# Patient Record
Sex: Female | Born: 1950
Health system: Southern US, Community
[De-identification: ages and names within clinical notes are randomized; demographics above are authoritative.]

## PROBLEM LIST (undated history)

## (undated) DIAGNOSIS — J189 Pneumonia, unspecified organism: Secondary | ICD-10-CM

## (undated) DIAGNOSIS — M1990 Unspecified osteoarthritis, unspecified site: Secondary | ICD-10-CM

## (undated) DIAGNOSIS — I10 Essential (primary) hypertension: Secondary | ICD-10-CM

## (undated) DIAGNOSIS — E7800 Pure hypercholesterolemia, unspecified: Secondary | ICD-10-CM

---

## 2001-09-28 ENCOUNTER — Encounter: Admission: RE | Admit: 2001-09-28 | Discharge: 2001-09-28 | Payer: Self-pay | Admitting: Emergency Medicine

## 2001-09-28 ENCOUNTER — Encounter: Payer: Self-pay | Admitting: Emergency Medicine

## 2003-05-14 ENCOUNTER — Encounter: Admission: RE | Admit: 2003-05-14 | Discharge: 2003-05-14 | Payer: Self-pay | Admitting: Emergency Medicine

## 2003-05-14 ENCOUNTER — Encounter: Payer: Self-pay | Admitting: Emergency Medicine

## 2003-09-19 ENCOUNTER — Encounter: Admission: RE | Admit: 2003-09-19 | Discharge: 2003-09-19 | Payer: Self-pay | Admitting: Emergency Medicine

## 2003-10-28 ENCOUNTER — Ambulatory Visit (HOSPITAL_COMMUNITY): Admission: RE | Admit: 2003-10-28 | Discharge: 2003-10-28 | Payer: Self-pay | Admitting: Emergency Medicine

## 2008-08-20 ENCOUNTER — Encounter: Admission: RE | Admit: 2008-08-20 | Discharge: 2008-08-20 | Payer: Self-pay | Admitting: Emergency Medicine

## 2009-03-08 ENCOUNTER — Emergency Department (HOSPITAL_COMMUNITY): Admission: EM | Admit: 2009-03-08 | Discharge: 2009-03-09 | Payer: Self-pay | Admitting: Emergency Medicine

## 2010-03-03 ENCOUNTER — Encounter: Admission: RE | Admit: 2010-03-03 | Discharge: 2010-03-03 | Payer: Self-pay | Admitting: Internal Medicine

## 2011-01-26 LAB — URINE MICROSCOPIC-ADD ON

## 2011-01-26 LAB — URINALYSIS, ROUTINE W REFLEX MICROSCOPIC
Bilirubin Urine: NEGATIVE
Glucose, UA: NEGATIVE mg/dL
Ketones, ur: NEGATIVE mg/dL
Nitrite: NEGATIVE
Urobilinogen, UA: 0.2 mg/dL (ref 0.0–1.0)

## 2011-01-26 LAB — URINE CULTURE

## 2011-05-19 ENCOUNTER — Other Ambulatory Visit: Payer: Self-pay | Admitting: Internal Medicine

## 2011-05-19 DIAGNOSIS — Z1231 Encounter for screening mammogram for malignant neoplasm of breast: Secondary | ICD-10-CM

## 2011-05-26 ENCOUNTER — Ambulatory Visit
Admission: RE | Admit: 2011-05-26 | Discharge: 2011-05-26 | Disposition: A | Discharge status: Home / Self Care | Payer: 59 | Source: Ambulatory Visit | Attending: Internal Medicine | Admitting: Internal Medicine

## 2011-05-26 DIAGNOSIS — Z1231 Encounter for screening mammogram for malignant neoplasm of breast: Secondary | ICD-10-CM

## 2011-12-21 ENCOUNTER — Emergency Department (HOSPITAL_COMMUNITY)
Admission: EM | Admit: 2011-12-21 | Discharge: 2011-12-21 | Disposition: A | Discharge status: Home / Self Care | Payer: 59 | Attending: Emergency Medicine | Admitting: Emergency Medicine

## 2011-12-21 ENCOUNTER — Emergency Department (HOSPITAL_COMMUNITY): Payer: 59

## 2011-12-21 ENCOUNTER — Other Ambulatory Visit: Payer: Self-pay

## 2011-12-21 ENCOUNTER — Encounter (HOSPITAL_COMMUNITY): Payer: Self-pay | Admitting: *Deleted

## 2011-12-21 DIAGNOSIS — R05 Cough: Secondary | ICD-10-CM | POA: Insufficient documentation

## 2011-12-21 DIAGNOSIS — R059 Cough, unspecified: Secondary | ICD-10-CM | POA: Insufficient documentation

## 2011-12-21 DIAGNOSIS — R110 Nausea: Secondary | ICD-10-CM | POA: Insufficient documentation

## 2011-12-21 DIAGNOSIS — E860 Dehydration: Secondary | ICD-10-CM

## 2011-12-21 DIAGNOSIS — R42 Dizziness and giddiness: Secondary | ICD-10-CM | POA: Insufficient documentation

## 2011-12-21 DIAGNOSIS — R0602 Shortness of breath: Secondary | ICD-10-CM | POA: Insufficient documentation

## 2011-12-21 DIAGNOSIS — Z8701 Personal history of pneumonia (recurrent): Secondary | ICD-10-CM | POA: Insufficient documentation

## 2011-12-21 HISTORY — DX: Unspecified osteoarthritis, unspecified site: M19.90

## 2011-12-21 HISTORY — DX: Pure hypercholesterolemia, unspecified: E78.00

## 2011-12-21 HISTORY — DX: Pneumonia, unspecified organism: J18.9

## 2011-12-21 HISTORY — DX: Essential (primary) hypertension: I10

## 2011-12-21 LAB — CBC
Hemoglobin: 12.9 g/dL (ref 12.0–15.0)
MCH: 30.6 pg (ref 26.0–34.0)
MCV: 91.7 fL (ref 78.0–100.0)
Platelets: 211 10*3/uL (ref 150–400)
WBC: 10.3 10*3/uL (ref 4.0–10.5)

## 2011-12-21 LAB — BASIC METABOLIC PANEL
BUN: 23 mg/dL (ref 6–23)
CO2: 28 mEq/L (ref 19–32)
Chloride: 102 mEq/L (ref 96–112)
GFR calc non Af Amer: 41 mL/min — ABNORMAL LOW (ref >90)
Glucose, Bld: 107 mg/dL — ABNORMAL HIGH (ref 70–99)

## 2011-12-21 LAB — DIFFERENTIAL
Basophils Absolute: 0.1 10*3/uL (ref 0.0–0.1)
Eosinophils Absolute: 0.4 10*3/uL (ref 0.0–0.7)
Eosinophils Relative: 4 % (ref 0–5)
Lymphocytes Relative: 28 % (ref 12–46)
Monocytes Absolute: 0.8 10*3/uL (ref 0.1–1.0)
Monocytes Relative: 8 % (ref 3–12)
Neutro Abs: 6.1 10*3/uL (ref 1.7–7.7)
Neutrophils Relative %: 60 % (ref 43–77)

## 2011-12-21 MED ORDER — ALBUTEROL SULFATE (5 MG/ML) 0.5% IN NEBU
5.0000 mg | INHALATION_SOLUTION | Freq: Once | RESPIRATORY_TRACT | Status: AC
  Administered 2011-12-21: 5 mg via RESPIRATORY_TRACT
  Filled 2011-12-21: qty 1

## 2011-12-21 MED ORDER — SODIUM CHLORIDE 0.9 % IV BOLUS (SEPSIS)
1000.0000 mL | Freq: Once | INTRAVENOUS | Status: AC
  Administered 2011-12-21: 1000 mL via INTRAVENOUS

## 2011-12-21 MED ORDER — ONDANSETRON HCL 4 MG/2ML IJ SOLN
4.0000 mg | Freq: Once | INTRAMUSCULAR | Status: AC
  Administered 2011-12-21: 4 mg via INTRAVENOUS
  Filled 2011-12-21: qty 2

## 2011-12-21 MED ORDER — IPRATROPIUM BROMIDE 0.02 % IN SOLN
0.5000 mg | Freq: Once | RESPIRATORY_TRACT | Status: AC
  Administered 2011-12-21: 0.5 mg via RESPIRATORY_TRACT
  Filled 2011-12-21: qty 2.5

## 2011-12-21 NOTE — ED Notes (Signed)
Patient returned from X-ray 

## 2011-12-21 NOTE — ED Provider Notes (Signed)
Medical screening examination/treatment/procedure(s) were conducted as a shared visit with non-physician practitioner(s) and myself.  I personally evaluated the patient during the encounter  Doug Sou, MD 12/21/11 1749

## 2011-12-21 NOTE — ED Notes (Signed)
Patient transported to X-ray 

## 2011-12-21 NOTE — ED Notes (Signed)
Pt from home with reports of dizziness and nausea off and on for 2 weeks but worsened this morning. Pt recently diagnosed with an URI and pneumonia treated with oral antibiotics.

## 2011-12-21 NOTE — ED Notes (Signed)
RT aware of breathing tx, will monitor. 

## 2011-12-21 NOTE — ED Provider Notes (Signed)
History     CSN: [id number]  Arrival date & time 12/21/11  1478   First MD Initiated Contact with Patient 12/21/11 [phone]      Chief Complaint  Patient presents with  . Dizziness  . Nausea    (Consider location/radiation/quality/duration/timing/severity/associated sxs/prior treatment) HPI History provided by pt.   Pt reports that she has had cough productive of clear sputum as well as SOB and dizziness x 1.5-2wks.  Recent CXR positive for pneumonia and pt treated w/ 5pak.  Cough has improved but SOB persistent and dizziness acutely worsened this morning.  Describes as lightheadedness that is aggravated by standing from seated position as well as movement and is usually associated w/ nausea.  No recent CP, palpitations, abd pain, vomiting or diarrhea.  No neurologic complaints.  Has not had a syncopal episode.  Has been drinking fluids.  No recent medication changes.     Past Medical History  Diagnosis Date  . Hypertension   . Osteoporosis   . Arthritis   . High cholesterol   . Pneumonia     History reviewed. No pertinent past surgical history.  History reviewed. No pertinent family history.  History  Substance Use Topics  . Smoking status: Never Smoker   . Smokeless tobacco: Never Used  . Alcohol Use: No    OB History    Grav Para Term Preterm Abortions TAB SAB Ect Mult Living                  Review of Systems  All other systems reviewed and are negative.    Allergies  Azithromycin  Home Medications  No current outpatient prescriptions on file.  BP 135/80  Pulse 72  Temp(Src) 98.3 F (36.8 C) (Oral)  Resp 18  Wt 170 lb (77.111 kg)  SpO2 100%  Physical Exam  Nursing note and vitals reviewed. Constitutional: She is oriented to person, place, and time. She appears well-developed and well-nourished. No distress.  HENT:  Head: Normocephalic and atraumatic.  Mouth/Throat: Oropharynx is clear and moist.  Eyes:       Normal appearance  Neck: Normal range  of motion.  Cardiovascular: Normal rate, regular rhythm and intact distal pulses.   Pulmonary/Chest: Effort normal and breath sounds normal.  Abdominal: Soft. Bowel sounds are normal. She exhibits no distension. There is no tenderness.       No CVA tenderness  Musculoskeletal: Normal range of motion. She exhibits no edema and no tenderness.  Neurological: She is alert and oriented to person, place, and time.  Skin: Skin is warm and dry. No rash noted.  Psychiatric: She has a normal mood and affect. Her behavior is normal.    ED Course  Procedures (including critical care time)   Date: 12/21/2011  Rate: 71  Rhythm: normal sinus rhythm  QRS Axis: normal  Intervals: normal  ST/T Wave abnormalities: normal  Conduction Disutrbances:none  Narrative Interpretation:   Old EKG Reviewed: none available   Labs Reviewed  BASIC METABOLIC PANEL - Abnormal; Notable for the following:    Glucose, Bld 107 (*)    Creatinine, Ser 1.36 (*)    Calcium 10.9 (*)    GFR calc non Af Amer 41 (*)    GFR calc Af Amer 47 (*)    All other components within normal limits  CBC  DIFFERENTIAL  POCT I-STAT TROPONIN I   Dg Chest 2 View  12/21/2011  *RADIOLOGY REPORT*  Clinical Data: 61 year old female with cough.  Pneumonia.  CHEST -  2 VIEW  Comparison: None.  Findings: Low lung volumes.  No pleural effusion or consolidation. No pneumothorax or pulmonary edema.  Cardiac size and mediastinal contours are within normal limits.  Visualized tracheal air column is within normal limits.  No acute osseous abnormality identified.  IMPRESSION: Low lung volumes, otherwise no acute cardiopulmonary abnormality.  Original Report Authenticated By: Harley Hallmark, M.D.     1. Dehydration       MDM  61yo F recently diagnosed with and treated for pneumonia presents w/ c/o lightheadedness.  Has had persistent cough and SOB as well.  On exam, afebrile and VS w/in nml range, NAD, well hydrated, heart w/ RRR, coughing, lungs  clear.  EKG, repeat CXR and labs pending.  Albuterol/atrovent neb ordered because gave pt relief at doctor's office last week.  She is receiving IV NS bolus as well.  Will reassess shortly.  9:30 AM   CXR neg for pneumonia.  Labs sig for neg troponin and elevated Cr, likely secondary to dehydration based on BUN/Cr ratio.  EKG non-ischemic. All results discussed w/ pt.  Sh reports that cough and SOB have improved w/ albuterol.  Lightheadedness likely secondary to URI as well as dehydration.  Will reassess when bolus completed and likely d/c home at that time.  10:42 AM   Pt is smiling and reports feeling better.  D/c'd home.  Recommended oral hydration and f/u with Dr. Felipa Eth this week.  11:26 AM        Otilio Miu, PA 12/21/11 1126

## 2011-12-21 NOTE — ED Provider Notes (Signed)
Complains of lightheadedness continued cough for 2 weeks accompanied by nausea. Treated with azithromycin with some improvement of cough denies fever. Seen by[facility] diagnosed with pneumonia. Cough is improved nausea and lightheadedness continues. Reports had single episode of right upper quadrant pain 5 days ago which has resolved none since On exam alert nontoxic heart regular rate and rhythm no murmurs lungs clear to auscultation with coughing abdomen obese nontender extremities without edema   Doug Sou, MD 12/21/11 [phone]

## 2011-12-21 NOTE — ED Notes (Signed)
PA at bedside.

## 2012-11-28 ENCOUNTER — Other Ambulatory Visit: Payer: Self-pay | Admitting: Internal Medicine

## 2012-11-28 DIAGNOSIS — Z1231 Encounter for screening mammogram for malignant neoplasm of breast: Secondary | ICD-10-CM

## 2012-12-26 ENCOUNTER — Ambulatory Visit: Payer: 59

## 2013-01-29 ENCOUNTER — Encounter (HOSPITAL_COMMUNITY): Payer: Self-pay | Admitting: Emergency Medicine

## 2013-01-29 ENCOUNTER — Emergency Department (HOSPITAL_COMMUNITY)
Admission: EM | Admit: 2013-01-29 | Discharge: 2013-01-29 | Disposition: A | Discharge status: Home / Self Care | Payer: No Typology Code available for payment source | Source: Home / Self Care | Attending: Emergency Medicine | Admitting: Emergency Medicine

## 2013-01-29 ENCOUNTER — Emergency Department (INDEPENDENT_AMBULATORY_CARE_PROVIDER_SITE_OTHER): Payer: No Typology Code available for payment source

## 2013-01-29 DIAGNOSIS — S7000XA Contusion of unspecified hip, initial encounter: Secondary | ICD-10-CM

## 2013-01-29 DIAGNOSIS — S7002XA Contusion of left hip, initial encounter: Secondary | ICD-10-CM

## 2013-01-29 MED ORDER — IBUPROFEN 600 MG PO TABS: 600.0000 mg | ORAL_TABLET | Freq: Three times a day (TID) | ORAL | Status: DC | PRN

## 2013-01-29 MED ORDER — HYDROCODONE-ACETAMINOPHEN 5-325 MG PO TABS: 2.0000 | ORAL_TABLET | ORAL | Status: DC | PRN

## 2013-01-29 NOTE — ED Provider Notes (Addendum)
History     CSN: [id number]  Arrival date & time 01/29/13  1409   First MD Initiated Contact with Patient 01/29/13 1416      Chief Complaint  Patient presents with  . Back Pain    (Consider location/radiation/quality/duration/timing/severity/associated sxs/prior treatment) HPI Comments: Reports falling of porch onto a plastic table, presents to urgent care complaining of left-sided lower back pain exacerbated with walking and movement. ( Patient points to posterior aspect of left hip), urged more when I lay down"... patient denies any abdominal pain changes in urination patterns, denies any weakness or paresthesias to her lower extremities. Denies any nausea or vomiting did take a muscle relaxer yesterday but did not help much. "It's sore at touch and with movement the left lower part of my back'....  Patient is a 62 y.o. female presenting with back pain. The history is provided by the patient.  Back Pain Location:  Sacro-iliac joint Quality:  Aching and stiffness Stiffness is present:  All day Radiates to:  Does not radiate Pain severity:  Moderate Onset quality:  Sudden Duration:  1 day Timing:  Constant Progression:  Partially resolved Chronicity:  New Context: not falling   Worsened by:  Nothing tried Ineffective treatments:  None tried Associated symptoms: no abdominal pain, no bowel incontinence, no chest pain, no dysuria, no fever, no leg pain, no numbness, no perianal numbness, no tingling and no weakness   Risk factors: no hx of cancer     Past Medical History  Diagnosis Date  . Hypertension   . Osteoporosis   . Arthritis   . High cholesterol   . Pneumonia     History reviewed. No pertinent past surgical history.  History reviewed. No pertinent family history.  History  Substance Use Topics  . Smoking status: Never Smoker   . Smokeless tobacco: Never Used  . Alcohol Use: No    OB History   Grav Para Term Preterm Abortions TAB SAB Ect Mult Living                Review of Systems  Constitutional: Positive for activity change. Negative for fever, chills, diaphoresis, appetite change and fatigue.  Respiratory: Negative for shortness of breath.   Cardiovascular: Negative for chest pain and leg swelling.  Gastrointestinal: Negative for abdominal pain and bowel incontinence.  Genitourinary: Negative for dysuria.  Musculoskeletal: Positive for back pain. Negative for myalgias, joint swelling and arthralgias.  Skin: Negative for pallor, rash and wound.  Neurological: Negative for tingling, weakness and numbness.    Allergies  Azithromycin  Home Medications   Current Outpatient Rx  Name  Route  Sig  Dispense  Refill  . albuterol (PROAIR HFA) 108 (90 BASE) MCG/ACT inhaler   Inhalation   Inhale 2 puffs into the lungs 2 (two) times daily. Wheezing         . benazepril-hydrochlorthiazide (LOTENSIN HCT) 20-12.5 MG per tablet   Oral   Take 1 tablet by mouth daily.         . fluticasone-salmeterol (ADVAIR HFA) 45-21 MCG/ACT inhaler   Inhalation   Inhale 2 puffs into the lungs 2 (two) times daily.         Marland Kitchen HYDROcodone-acetaminophen (NORCO/VICODIN) 5-325 MG per tablet   Oral   Take 2 tablets by mouth every 4 (four) hours as needed for pain.   10 tablet   0   . Hydrocodone-Homatropine (HYDROMET PO)   Oral   Take 5 mLs by mouth every 6 (six) hours. Cough         .  ibuprofen (ADVIL,MOTRIN) 600 MG tablet   Oral   Take 1 tablet (600 mg total) by mouth every 8 (eight) hours as needed for pain.   15 tablet   0   . levofloxacin (LEVAQUIN) 500 MG tablet   Oral   Take 500 mg by mouth daily. Pt finished on Friday 12-17-11 for 7 day therapy         . Multiple Vitamins-Minerals (MULTIVITAMIN WITH MINERALS) tablet   Oral   Take 1 tablet by mouth daily.         . predniSONE (DELTASONE) 20 MG tablet   Oral   Take 20 mg by mouth daily. Finished therapy on Friday ( 12-17-11 ) for 7 day therapy         . simvastatin (ZOCOR)  20 MG tablet   Oral   Take 20 mg by mouth every evening.           BP 150/90  Pulse 81  Temp(Src) 98.4 F (36.9 C) (Oral)  Resp 18  SpO2 99%  Physical Exam  Nursing note and vitals reviewed. Constitutional: Vital signs are normal.  Non-toxic appearance. She does not have a sickly appearance. She does not appear ill. No distress.  Eyes: Conjunctivae are normal.  Abdominal: Soft. There is no tenderness.  Musculoskeletal: She exhibits tenderness.       Back:  Neurological: She is alert.  Skin: No rash noted. No erythema.    ED Course  Procedures (including critical care time)  Labs Reviewed - No data to display Dg Hip Complete Left  01/29/2013  *RADIOLOGY REPORT*  Clinical Data: Pain  LEFT HIP - COMPLETE 2+ VIEW  Comparison: None.  Findings: There is mild joint space narrowing bilaterally.  No osteophyte formation.  No evidence of avascular necrosis.  No evidence of pelvic or hip fracture.  Sacroiliac osteoarthritis is present bilaterally.  IMPRESSION: Mild joint space narrowing bilaterally.  The no acute or traumatic finding.  Bilateral sacroiliac osteoarthritis.   Original Report Authenticated By: Paulina Fusi, M.D.      1. Contusion, hip, left, initial encounter       MDM  Status post fall with left hip and lower back contusion/ patient was prescribed ibuprofen -stripped her to followup with primary care doctor pain was to persist beyond 3-5 days.  Jimmie Molly, MD 01/29/13 1554  Jimmie Molly, MD 01/29/13 1554  Jimmie Molly, MD 01/29/13 [phone]

## 2013-01-29 NOTE — ED Notes (Signed)
Pt c/o lower back pain on left side onset yest Reports falling of porch onto a plastic table Pain increases w/activity and when she coughs Denies: f/v/n/d Took a muscle relaxer thinking it was muscle spasms   She is alert and oriented w/no signs of acute distress.

## 2013-12-11 ENCOUNTER — Ambulatory Visit
Admission: RE | Admit: 2013-12-11 | Discharge: 2013-12-11 | Disposition: A | Discharge status: Home / Self Care | Payer: No Typology Code available for payment source | Source: Ambulatory Visit | Attending: Internal Medicine | Admitting: Internal Medicine

## 2013-12-11 DIAGNOSIS — Z1231 Encounter for screening mammogram for malignant neoplasm of breast: Secondary | ICD-10-CM

## 2014-03-30 ENCOUNTER — Encounter (HOSPITAL_COMMUNITY): Payer: Self-pay | Admitting: Emergency Medicine

## 2014-03-30 ENCOUNTER — Emergency Department (HOSPITAL_COMMUNITY)
Admission: EM | Admit: 2014-03-30 | Discharge: 2014-03-30 | Disposition: A | Discharge status: Home / Self Care | Payer: Medicare HMO | Source: Home / Self Care | Attending: Emergency Medicine | Admitting: Emergency Medicine

## 2014-03-30 DIAGNOSIS — S161XXA Strain of muscle, fascia and tendon at neck level, initial encounter: Secondary | ICD-10-CM

## 2014-03-30 DIAGNOSIS — S139XXA Sprain of joints and ligaments of unspecified parts of neck, initial encounter: Secondary | ICD-10-CM

## 2014-03-30 DIAGNOSIS — X58XXXA Exposure to other specified factors, initial encounter: Secondary | ICD-10-CM

## 2014-03-30 MED ORDER — IBUPROFEN 600 MG PO TABS: 600.0000 mg | ORAL_TABLET | Freq: Three times a day (TID) | ORAL | Status: DC | PRN

## 2014-03-30 MED ORDER — IBUPROFEN 800 MG PO TABS
ORAL_TABLET | ORAL | Status: AC
  Filled 2014-03-30: qty 1

## 2014-03-30 MED ORDER — METAXALONE 400 MG PO TABS: 400.0000 mg | ORAL_TABLET | Freq: Two times a day (BID) | ORAL | Status: DC | PRN

## 2014-03-30 MED ORDER — IBUPROFEN 800 MG PO TABS
800.0000 mg | ORAL_TABLET | Freq: Once | ORAL | Status: AC
  Administered 2014-03-30: 800 mg via ORAL

## 2014-03-30 NOTE — ED Notes (Signed)
Pt  Reports  Neck  Pain  And  Upper  Back pain  As  Well  X  5  Days  denys  Any  Injury  Reports  Her  Neck  Feels  stiff

## 2014-03-30 NOTE — Discharge Instructions (Signed)
Muscle Strain A muscle strain is an injury that occurs when a muscle is stretched beyond its normal length. Usually a small number of muscle fibers are torn when this happens. Muscle strain is rated in degrees. First-degree strains have the least amount of muscle fiber tearing and pain. Second-degree and third-degree strains have increasingly more tearing and pain.  Usually, recovery from muscle strain takes 1 2 weeks. Complete healing takes 5 6 weeks.  CAUSES  Muscle strain happens when a sudden, violent force placed on a muscle stretches it too far. This may occur with lifting, sports, or a fall.  RISK FACTORS Muscle strain is especially common in athletes.  SIGNS AND SYMPTOMS At the site of the muscle strain, there may be:  Pain.  Bruising.  Swelling.  Difficulty using the muscle due to pain or lack of normal function. DIAGNOSIS  Your health care provider will perform a physical exam and ask about your medical history. TREATMENT  Often, the best treatment for a muscle strain is resting, icing, and applying cold compresses to the injured area.  HOME CARE INSTRUCTIONS   Use the PRICE method of treatment to promote muscle healing during the first 2 3 days after your injury. The PRICE method involves:  Protecting the muscle from being injured again.  Restricting your activity and resting the injured body part.  Icing your injury. To do this, put ice in a plastic bag. Place a towel between your skin and the bag. Then, apply the ice and leave it on from 15 20 minutes each hour. After the third day, switch to moist heat packs.  Apply compression to the injured area with a splint or elastic bandage. Be careful not to wrap it too tightly. This may interfere with blood circulation or increase swelling.  Elevate the injured body part above the level of your heart as often as you can.  Only take over-the-counter or prescription medicines for pain, discomfort, or fever as directed by your  health care provider.  Warming up prior to exercise helps to prevent future muscle strains. SEEK MEDICAL CARE IF:   You have increasing pain or swelling in the injured area.  You have numbness, tingling, or a significant loss of strength in the injured area. MAKE SURE YOU:   Understand these instructions.  Will watch your condition.  Will get help right away if you are not doing well or get worse. Document Released: 10/04/2005 Document Revised: 07/25/2013 Document Reviewed: 05/03/2013 [facility] Patient Information 2014 [place], [place].  Cervical Sprain A cervical sprain is an injury in the neck in which the strong, fibrous tissues (ligaments) that connect your neck bones stretch or tear. Cervical sprains can range from mild to severe. Severe cervical sprains can cause the neck vertebrae to be unstable. This can lead to damage of the spinal cord and can result in serious nervous system problems. The amount of time it takes for a cervical sprain to get better depends on the cause and extent of the injury. Most cervical sprains heal in 1 to 3 weeks. CAUSES  Severe cervical sprains may be caused by:   Contact sport injuries (such as from football, rugby, wrestling, hockey, auto racing, gymnastics, diving, martial arts, or boxing).   Motor vehicle collisions.   Whiplash injuries. This is an injury from a sudden forward-and backward whipping movement of the head and neck.  Falls.  Mild cervical sprains may be caused by:   Being in an awkward position, such as while cradling a telephone  between your ear and shoulder.   Sitting in a chair that does not offer proper support.   Working at a poorly Landscape [employer] station.   Looking up or down for long periods of time.  SYMPTOMS   Pain, soreness, stiffness, or a burning sensation in the front, back, or sides of the neck. This discomfort may develop immediately after the injury or slowly, 24 hours or more after the injury.    Pain or tenderness directly in the middle of the back of the neck.   Shoulder or upper back pain.   Limited ability to move the neck.   Headache.   Dizziness.   Weakness, numbness, or tingling in the hands or arms.   Muscle spasms.   Difficulty swallowing or chewing.   Tenderness and swelling of the neck.  DIAGNOSIS  Most of the time your health care provider can diagnose a cervical sprain by taking your history and doing a physical exam. Your health care provider will ask about previous neck injuries and any known neck problems, such as arthritis in the neck. X-rays may be taken to find out if there are any other problems, such as with the bones of the neck. Other tests, such as a CT scan or MRI, may also be needed.  TREATMENT  Treatment depends on the severity of the cervical sprain. Mild sprains can be treated with rest, keeping the neck in place (immobilization), and pain medicines. Severe cervical sprains are immediately immobilized. Further treatment is done to help with pain, muscle spasms, and other symptoms and may include:  Medicines, such as pain relievers, numbing medicines, or muscle relaxants.   Physical therapy. This may involve stretching exercises, strengthening exercises, and posture training. Exercises and improved posture can help stabilize the neck, strengthen muscles, and help stop symptoms from returning.  HOME CARE INSTRUCTIONS   Put ice on the injured area.   Put ice in a plastic bag.   Place a towel between your skin and the bag.   Leave the ice on for 15 20 minutes, 3 4 times a day.   If your injury was severe, you may have been given a cervical collar to wear. A cervical collar is a two-piece collar designed to keep your neck from moving while it heals.  Do not remove the collar unless instructed by your health care provider.  If you have long hair, keep it outside of the collar.  Ask your health care provider before making any  adjustments to your collar. Minor adjustments may be required over time to improve comfort and reduce pressure on your chin or on the back of your head.  Ifyou are allowed to remove the collar for cleaning or bathing, follow your health care provider's instructions on how to do so safely.  Keep your collar clean by wiping it with mild soap and water and drying it completely. If the collar you have been given includes removable pads, remove them every 1 2 days and hand wash them with soap and water. Allow them to air dry. They should be completely dry before you wear them in the collar.  If you are allowed to remove the collar for cleaning and bathing, wash and dry the skin of your neck. Check your skin for irritation or sores. If you see any, tell your health care provider.  Do not drive while wearing the collar.   Only take over-the-counter or prescription medicines for pain, discomfort, or fever as directed by your health care  provider.   Keep all follow-up appointments as directed by your health care provider.   Keep all physical therapy appointments as directed by your health care provider.   Make any needed adjustments to your workstation to promote good posture.   Avoid positions and activities that make your symptoms worse.   Warm up and stretch before being active to help prevent problems.  SEEK MEDICAL CARE IF:   Your pain is not controlled with medicine.   You are unable to decrease your pain medicine over time as planned.   Your activity level is not improving as expected.  SEEK IMMEDIATE MEDICAL CARE IF:   You develop any bleeding.  You develop stomach upset.  You have signs of an allergic reaction to your medicine.   Your symptoms get worse.   You develop new, unexplained symptoms.   You have numbness, tingling, weakness, or paralysis in any part of your body.  MAKE SURE YOU:   Understand these instructions.  Will watch your condition.  Will  get help right away if you are not doing well or get worse. Document Released: 08/01/2007 Document Revised: 07/25/2013 Document Reviewed: 04/11/2013 [facility] Patient Information 2014 [place].

## 2014-03-30 NOTE — ED Provider Notes (Signed)
Medical screening examination/treatment/procedure(s) were performed by non-physician practitioner and as supervising physician I was immediately available for consultation/collaboration.  Philipp Deputy, M.D.  Harden Mo, MD 03/30/14 2213

## 2014-03-30 NOTE — ED Provider Notes (Signed)
CSN: [id number]     Arrival date & time 03/30/14  4627 History   First MD Initiated Contact with Patient 03/30/14 1023     Chief Complaint  Patient presents with  . Torticollis   (Consider location/radiation/quality/duration/timing/severity/associated sxs/prior Treatment) HPI Comments: Patient developed "crick" in right lateral neck and along top of right shoulder on 03/25/2014. 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 Has not tried any sort of medications as home for pain. Denies specific injury. No changes in strength or sensation of either upper extremity. No chest pain or dyspnea. No fever or headache.  PCP: Dr. Dagmar Hait  The history is provided by the patient.    Past Medical History  Diagnosis Date  . Hypertension   . Osteoporosis   . Arthritis   . High cholesterol   . Pneumonia    History reviewed. No pertinent past surgical history. History reviewed. No pertinent family history. History  Substance Use Topics  . Smoking status: Never Smoker   . Smokeless tobacco: Never Used  . Alcohol Use: No   OB History   Grav Para Term Preterm Abortions TAB SAB Ect Mult Living                 Review of Systems  All other systems reviewed and are negative.   Allergies  Azithromycin  Home Medications   Prior to Admission medications   Medication Sig Start Date End Date Taking? Authorizing Provider  albuterol (PROAIR HFA) 108 (90 BASE) MCG/ACT inhaler Inhale 2 puffs into the lungs 2 (two) times daily. Wheezing    Historical Provider, MD  benazepril-hydrochlorthiazide (LOTENSIN HCT) 20-12.5 MG per tablet Take 1 tablet by mouth daily.    Historical Provider, MD  fluticasone-salmeterol (ADVAIR HFA) [phone] MCG/ACT inhaler Inhale 2 puffs into the lungs 2 (two) times daily.    Historical Provider, MD  HYDROcodone-acetaminophen (NORCO/VICODIN) 5-325 MG per tablet Take 2 tablets by mouth every 4 (four) hours as needed for  pain. 01/29/13   Rosana Hoes, MD  Hydrocodone-Homatropine (HYDROMET PO) Take 5 mLs by mouth every 6 (six) hours. Cough    Historical Provider, MD  ibuprofen (ADVIL,MOTRIN) 600 MG tablet Take 1 tablet (600 mg total) by mouth every 8 (eight) hours as needed for pain. 01/29/13   Rosana Hoes, MD  ibuprofen (ADVIL,MOTRIN) 600 MG tablet Take 1 tablet (600 mg total) by mouth every 8 (eight) hours as needed for mild pain or moderate pain. 03/30/14   Lahoma Rocker, PA  levofloxacin (LEVAQUIN) 500 MG tablet Take 500 mg by mouth daily. Pt finished on Friday 12-17-11 for 7 day therapy    Historical Provider, MD  metaxalone (SKELAXIN) 400 MG tablet Take 1 tablet (400 mg total) by mouth 2 (two) times daily as needed for muscle spasms. 03/30/14   Lahoma Rocker, PA  Multiple Vitamins-Minerals (MULTIVITAMIN WITH MINERALS) tablet Take 1 tablet by mouth daily.    Historical Provider, MD  predniSONE (DELTASONE) 20 MG tablet Take 20 mg by mouth daily. Finished therapy on Friday ( 12-17-11 ) for 7 day therapy    Historical Provider, MD  simvastatin (ZOCOR) 20 MG tablet Take 20 mg by mouth every evening.    Historical Provider, MD   BP 150/91  Pulse 80  Temp(Src) 97.7 F (36.5 C) (Oral)  Resp 18  SpO2 94% Physical Exam  Nursing note and vitals reviewed. Constitutional: She is oriented to person, place, and time.  She appears well-developed and well-nourished. No distress.  HENT:  Head: Normocephalic and atraumatic.  Right Ear: Hearing and external ear normal.  Left Ear: Hearing and external ear normal.  Eyes: Conjunctivae are normal. No scleral icterus.  Neck: Trachea normal and phonation normal. Neck supple. Muscular tenderness present. No tracheal tenderness present. No mass and no thyromegaly present.    Outlined area is with tenderness with ROM and palpation  Cardiovascular: Normal rate, regular rhythm and normal heart sounds.   Pulmonary/Chest: Effort normal and breath sounds normal. No stridor.    Musculoskeletal: She exhibits no edema.  Neurological: She is alert and oriented to person, place, and time.  Skin: Skin is warm and dry.  Psychiatric: She has a normal mood and affect. Her behavior is normal.    ED Course  Procedures (including critical care time) Labs Review Labs Reviewed - No data to display  Imaging Review No results found.   MDM   1. Cervical myofascial strain    Mild cervical strain with associated muscle spasm and without rash or focal neurological deficit. Will treat with ibuprofen and skelaxin and advise limits to heavy lifting over next 7 days and follow up with PCP if no improvement.   [place], [place] 03/30/14 1133

## 2015-06-05 ENCOUNTER — Other Ambulatory Visit: Payer: Self-pay | Admitting: Internal Medicine

## 2015-06-05 DIAGNOSIS — Z1231 Encounter for screening mammogram for malignant neoplasm of breast: Secondary | ICD-10-CM

## 2015-06-18 ENCOUNTER — Ambulatory Visit
Admission: RE | Admit: 2015-06-18 | Discharge: 2015-06-18 | Disposition: A | Discharge status: Home / Self Care | Payer: Medicare HMO | Source: Ambulatory Visit | Attending: Internal Medicine | Admitting: Internal Medicine

## 2015-06-18 DIAGNOSIS — Z1231 Encounter for screening mammogram for malignant neoplasm of breast: Secondary | ICD-10-CM

## 2015-06-20 ENCOUNTER — Other Ambulatory Visit: Payer: Self-pay | Admitting: Internal Medicine

## 2015-06-20 DIAGNOSIS — R928 Other abnormal and inconclusive findings on diagnostic imaging of breast: Secondary | ICD-10-CM

## 2015-06-30 ENCOUNTER — Ambulatory Visit
Admission: RE | Admit: 2015-06-30 | Discharge: 2015-06-30 | Disposition: A | Discharge status: Home / Self Care | Payer: Medicare HMO | Source: Ambulatory Visit | Attending: Internal Medicine | Admitting: Internal Medicine

## 2015-06-30 DIAGNOSIS — R928 Other abnormal and inconclusive findings on diagnostic imaging of breast: Secondary | ICD-10-CM

## 2015-11-27 DIAGNOSIS — H25041 Posterior subcapsular polar age-related cataract, right eye: Secondary | ICD-10-CM | POA: Diagnosis not present

## 2015-12-02 DIAGNOSIS — E784 Other hyperlipidemia: Secondary | ICD-10-CM | POA: Diagnosis not present

## 2015-12-02 DIAGNOSIS — I10 Essential (primary) hypertension: Secondary | ICD-10-CM | POA: Diagnosis not present

## 2015-12-02 DIAGNOSIS — M859 Disorder of bone density and structure, unspecified: Secondary | ICD-10-CM | POA: Diagnosis not present

## 2015-12-09 DIAGNOSIS — E784 Other hyperlipidemia: Secondary | ICD-10-CM | POA: Diagnosis not present

## 2015-12-09 DIAGNOSIS — M859 Disorder of bone density and structure, unspecified: Secondary | ICD-10-CM | POA: Diagnosis not present

## 2015-12-09 DIAGNOSIS — E668 Other obesity: Secondary | ICD-10-CM | POA: Diagnosis not present

## 2015-12-09 DIAGNOSIS — Z1389 Encounter for screening for other disorder: Secondary | ICD-10-CM | POA: Diagnosis not present

## 2015-12-09 DIAGNOSIS — J45909 Unspecified asthma, uncomplicated: Secondary | ICD-10-CM | POA: Diagnosis not present

## 2015-12-09 DIAGNOSIS — Z0000 Encounter for general adult medical examination without abnormal findings: Secondary | ICD-10-CM | POA: Diagnosis not present

## 2015-12-09 DIAGNOSIS — Z6831 Body mass index (BMI) 31.0-31.9, adult: Secondary | ICD-10-CM | POA: Diagnosis not present

## 2015-12-09 DIAGNOSIS — I10 Essential (primary) hypertension: Secondary | ICD-10-CM | POA: Diagnosis not present

## 2015-12-09 DIAGNOSIS — J302 Other seasonal allergic rhinitis: Secondary | ICD-10-CM | POA: Diagnosis not present

## 2015-12-09 DIAGNOSIS — K219 Gastro-esophageal reflux disease without esophagitis: Secondary | ICD-10-CM | POA: Diagnosis not present

## 2016-12-08 DIAGNOSIS — M859 Disorder of bone density and structure, unspecified: Secondary | ICD-10-CM | POA: Diagnosis not present

## 2016-12-08 DIAGNOSIS — E784 Other hyperlipidemia: Secondary | ICD-10-CM | POA: Diagnosis not present

## 2016-12-08 DIAGNOSIS — Z0000 Encounter for general adult medical examination without abnormal findings: Secondary | ICD-10-CM | POA: Diagnosis not present

## 2016-12-08 DIAGNOSIS — I10 Essential (primary) hypertension: Secondary | ICD-10-CM | POA: Diagnosis not present

## 2016-12-15 DIAGNOSIS — M1990 Unspecified osteoarthritis, unspecified site: Secondary | ICD-10-CM | POA: Diagnosis not present

## 2016-12-15 DIAGNOSIS — M8580 Other specified disorders of bone density and structure, unspecified site: Secondary | ICD-10-CM | POA: Diagnosis not present

## 2016-12-15 DIAGNOSIS — J302 Other seasonal allergic rhinitis: Secondary | ICD-10-CM | POA: Diagnosis not present

## 2016-12-15 DIAGNOSIS — E669 Obesity, unspecified: Secondary | ICD-10-CM | POA: Diagnosis not present

## 2016-12-15 DIAGNOSIS — E784 Other hyperlipidemia: Secondary | ICD-10-CM | POA: Diagnosis not present

## 2016-12-15 DIAGNOSIS — I10 Essential (primary) hypertension: Secondary | ICD-10-CM | POA: Diagnosis not present

## 2016-12-15 DIAGNOSIS — Z1389 Encounter for screening for other disorder: Secondary | ICD-10-CM | POA: Diagnosis not present

## 2016-12-15 DIAGNOSIS — Z0000 Encounter for general adult medical examination without abnormal findings: Secondary | ICD-10-CM | POA: Diagnosis not present

## 2016-12-15 DIAGNOSIS — J45909 Unspecified asthma, uncomplicated: Secondary | ICD-10-CM | POA: Diagnosis not present

## 2016-12-15 DIAGNOSIS — K219 Gastro-esophageal reflux disease without esophagitis: Secondary | ICD-10-CM | POA: Diagnosis not present

## 2016-12-24 DIAGNOSIS — Z1211 Encounter for screening for malignant neoplasm of colon: Secondary | ICD-10-CM | POA: Diagnosis not present

## 2016-12-24 DIAGNOSIS — Z1212 Encounter for screening for malignant neoplasm of rectum: Secondary | ICD-10-CM | POA: Diagnosis not present

## 2016-12-30 DIAGNOSIS — Z1212 Encounter for screening for malignant neoplasm of rectum: Secondary | ICD-10-CM | POA: Diagnosis not present

## 2017-03-16 DIAGNOSIS — H2513 Age-related nuclear cataract, bilateral: Secondary | ICD-10-CM | POA: Diagnosis not present

## 2017-03-16 DIAGNOSIS — H25041 Posterior subcapsular polar age-related cataract, right eye: Secondary | ICD-10-CM | POA: Diagnosis not present

## 2017-06-11 DIAGNOSIS — R69 Illness, unspecified: Secondary | ICD-10-CM | POA: Diagnosis not present

## 2017-06-24 ENCOUNTER — Other Ambulatory Visit: Payer: Self-pay | Admitting: Internal Medicine

## 2017-06-24 DIAGNOSIS — Z124 Encounter for screening for malignant neoplasm of cervix: Secondary | ICD-10-CM | POA: Diagnosis not present

## 2017-06-24 DIAGNOSIS — D259 Leiomyoma of uterus, unspecified: Secondary | ICD-10-CM | POA: Diagnosis not present

## 2017-06-24 DIAGNOSIS — Z01419 Encounter for gynecological examination (general) (routine) without abnormal findings: Secondary | ICD-10-CM | POA: Diagnosis not present

## 2017-06-24 DIAGNOSIS — R61 Generalized hyperhidrosis: Secondary | ICD-10-CM | POA: Diagnosis not present

## 2017-06-24 DIAGNOSIS — Z6830 Body mass index (BMI) 30.0-30.9, adult: Secondary | ICD-10-CM | POA: Diagnosis not present

## 2017-06-24 DIAGNOSIS — Z1231 Encounter for screening mammogram for malignant neoplasm of breast: Secondary | ICD-10-CM

## 2017-06-24 DIAGNOSIS — M859 Disorder of bone density and structure, unspecified: Secondary | ICD-10-CM | POA: Diagnosis not present

## 2017-07-01 ENCOUNTER — Ambulatory Visit: Payer: Medicare HMO

## 2017-07-29 ENCOUNTER — Ambulatory Visit
Admission: RE | Admit: 2017-07-29 | Discharge: 2017-07-29 | Disposition: A | Discharge status: Home / Self Care | Payer: Medicare HMO | Source: Ambulatory Visit | Attending: Internal Medicine | Admitting: Internal Medicine

## 2017-07-29 DIAGNOSIS — Z1231 Encounter for screening mammogram for malignant neoplasm of breast: Secondary | ICD-10-CM

## 2017-11-08 DIAGNOSIS — Z6830 Body mass index (BMI) 30.0-30.9, adult: Secondary | ICD-10-CM | POA: Diagnosis not present

## 2017-11-08 DIAGNOSIS — M1990 Unspecified osteoarthritis, unspecified site: Secondary | ICD-10-CM | POA: Diagnosis not present

## 2017-11-08 DIAGNOSIS — M5416 Radiculopathy, lumbar region: Secondary | ICD-10-CM | POA: Diagnosis not present

## 2017-11-08 DIAGNOSIS — Z1389 Encounter for screening for other disorder: Secondary | ICD-10-CM | POA: Diagnosis not present

## 2017-11-08 DIAGNOSIS — I10 Essential (primary) hypertension: Secondary | ICD-10-CM | POA: Diagnosis not present

## 2018-03-14 DIAGNOSIS — I10 Essential (primary) hypertension: Secondary | ICD-10-CM | POA: Diagnosis not present

## 2018-03-14 DIAGNOSIS — Z6829 Body mass index (BMI) 29.0-29.9, adult: Secondary | ICD-10-CM | POA: Diagnosis not present

## 2018-03-14 DIAGNOSIS — E668 Other obesity: Secondary | ICD-10-CM | POA: Diagnosis not present

## 2018-03-14 DIAGNOSIS — K219 Gastro-esophageal reflux disease without esophagitis: Secondary | ICD-10-CM | POA: Diagnosis not present

## 2018-03-14 DIAGNOSIS — M25511 Pain in right shoulder: Secondary | ICD-10-CM | POA: Diagnosis not present

## 2018-03-14 DIAGNOSIS — E7849 Other hyperlipidemia: Secondary | ICD-10-CM | POA: Diagnosis not present

## 2018-03-14 DIAGNOSIS — M5416 Radiculopathy, lumbar region: Secondary | ICD-10-CM | POA: Diagnosis not present

## 2018-03-22 DIAGNOSIS — H5203 Hypermetropia, bilateral: Secondary | ICD-10-CM | POA: Diagnosis not present

## 2018-03-22 DIAGNOSIS — H25041 Posterior subcapsular polar age-related cataract, right eye: Secondary | ICD-10-CM | POA: Diagnosis not present

## 2018-03-22 DIAGNOSIS — H524 Presbyopia: Secondary | ICD-10-CM | POA: Diagnosis not present

## 2018-06-10 DIAGNOSIS — R69 Illness, unspecified: Secondary | ICD-10-CM | POA: Diagnosis not present

## 2018-08-21 DIAGNOSIS — H43812 Vitreous degeneration, left eye: Secondary | ICD-10-CM | POA: Diagnosis not present

## 2018-09-19 DIAGNOSIS — R82998 Other abnormal findings in urine: Secondary | ICD-10-CM | POA: Diagnosis not present

## 2018-09-19 DIAGNOSIS — I10 Essential (primary) hypertension: Secondary | ICD-10-CM | POA: Diagnosis not present

## 2018-09-19 DIAGNOSIS — M859 Disorder of bone density and structure, unspecified: Secondary | ICD-10-CM | POA: Diagnosis not present

## 2018-09-19 DIAGNOSIS — E7849 Other hyperlipidemia: Secondary | ICD-10-CM | POA: Diagnosis not present

## 2018-09-21 DIAGNOSIS — H43812 Vitreous degeneration, left eye: Secondary | ICD-10-CM | POA: Diagnosis not present

## 2018-09-26 DIAGNOSIS — M1990 Unspecified osteoarthritis, unspecified site: Secondary | ICD-10-CM | POA: Diagnosis not present

## 2018-09-26 DIAGNOSIS — Z0000 Encounter for general adult medical examination without abnormal findings: Secondary | ICD-10-CM | POA: Diagnosis not present

## 2018-09-26 DIAGNOSIS — J45909 Unspecified asthma, uncomplicated: Secondary | ICD-10-CM | POA: Diagnosis not present

## 2018-09-26 DIAGNOSIS — Z1212 Encounter for screening for malignant neoplasm of rectum: Secondary | ICD-10-CM | POA: Diagnosis not present

## 2018-09-26 DIAGNOSIS — E668 Other obesity: Secondary | ICD-10-CM | POA: Diagnosis not present

## 2018-09-26 DIAGNOSIS — I10 Essential (primary) hypertension: Secondary | ICD-10-CM | POA: Diagnosis not present

## 2018-09-26 DIAGNOSIS — E7849 Other hyperlipidemia: Secondary | ICD-10-CM | POA: Diagnosis not present

## 2018-09-26 DIAGNOSIS — K219 Gastro-esophageal reflux disease without esophagitis: Secondary | ICD-10-CM | POA: Diagnosis not present

## 2018-09-26 DIAGNOSIS — M859 Disorder of bone density and structure, unspecified: Secondary | ICD-10-CM | POA: Diagnosis not present

## 2018-09-26 DIAGNOSIS — N183 Chronic kidney disease, stage 3 (moderate): Secondary | ICD-10-CM | POA: Diagnosis not present

## 2018-09-26 DIAGNOSIS — M5489 Other dorsalgia: Secondary | ICD-10-CM | POA: Diagnosis not present

## 2019-04-04 DIAGNOSIS — I1310 Hypertensive heart and chronic kidney disease without heart failure, with stage 1 through stage 4 chronic kidney disease, or unspecified chronic kidney disease: Secondary | ICD-10-CM | POA: Diagnosis not present

## 2019-04-04 DIAGNOSIS — E785 Hyperlipidemia, unspecified: Secondary | ICD-10-CM | POA: Diagnosis not present

## 2019-04-04 DIAGNOSIS — N183 Chronic kidney disease, stage 3 (moderate): Secondary | ICD-10-CM | POA: Diagnosis not present

## 2019-07-14 DIAGNOSIS — R69 Illness, unspecified: Secondary | ICD-10-CM | POA: Diagnosis not present

## 2019-10-16 DIAGNOSIS — M859 Disorder of bone density and structure, unspecified: Secondary | ICD-10-CM | POA: Diagnosis not present

## 2019-10-16 DIAGNOSIS — E7849 Other hyperlipidemia: Secondary | ICD-10-CM | POA: Diagnosis not present

## 2019-10-17 DIAGNOSIS — Z1212 Encounter for screening for malignant neoplasm of rectum: Secondary | ICD-10-CM | POA: Diagnosis not present

## 2019-10-17 DIAGNOSIS — R82998 Other abnormal findings in urine: Secondary | ICD-10-CM | POA: Diagnosis not present

## 2019-10-17 DIAGNOSIS — I1310 Hypertensive heart and chronic kidney disease without heart failure, with stage 1 through stage 4 chronic kidney disease, or unspecified chronic kidney disease: Secondary | ICD-10-CM | POA: Diagnosis not present

## 2019-10-22 DIAGNOSIS — K219 Gastro-esophageal reflux disease without esophagitis: Secondary | ICD-10-CM | POA: Diagnosis not present

## 2019-10-22 DIAGNOSIS — R195 Other fecal abnormalities: Secondary | ICD-10-CM | POA: Diagnosis not present

## 2019-10-22 DIAGNOSIS — E785 Hyperlipidemia, unspecified: Secondary | ICD-10-CM | POA: Diagnosis not present

## 2019-10-22 DIAGNOSIS — Z0000 Encounter for general adult medical examination without abnormal findings: Secondary | ICD-10-CM | POA: Diagnosis not present

## 2019-10-22 DIAGNOSIS — M8580 Other specified disorders of bone density and structure, unspecified site: Secondary | ICD-10-CM | POA: Diagnosis not present

## 2019-10-22 DIAGNOSIS — N1831 Chronic kidney disease, stage 3a: Secondary | ICD-10-CM | POA: Diagnosis not present

## 2019-10-22 DIAGNOSIS — I1310 Hypertensive heart and chronic kidney disease without heart failure, with stage 1 through stage 4 chronic kidney disease, or unspecified chronic kidney disease: Secondary | ICD-10-CM | POA: Diagnosis not present

## 2019-10-22 DIAGNOSIS — J302 Other seasonal allergic rhinitis: Secondary | ICD-10-CM | POA: Diagnosis not present

## 2019-10-22 DIAGNOSIS — E669 Obesity, unspecified: Secondary | ICD-10-CM | POA: Diagnosis not present

## 2019-10-22 DIAGNOSIS — M1990 Unspecified osteoarthritis, unspecified site: Secondary | ICD-10-CM | POA: Diagnosis not present

## 2019-10-26 ENCOUNTER — Other Ambulatory Visit: Payer: Self-pay | Admitting: Internal Medicine

## 2019-10-26 DIAGNOSIS — Z1231 Encounter for screening mammogram for malignant neoplasm of breast: Secondary | ICD-10-CM

## 2019-10-30 ENCOUNTER — Other Ambulatory Visit: Payer: Self-pay

## 2019-10-30 ENCOUNTER — Ambulatory Visit
Admission: RE | Admit: 2019-10-30 | Discharge: 2019-10-30 | Disposition: A | Discharge status: Home / Self Care | Payer: Medicare HMO | Source: Ambulatory Visit | Attending: Internal Medicine | Admitting: Internal Medicine

## 2019-10-30 DIAGNOSIS — Z1231 Encounter for screening mammogram for malignant neoplasm of breast: Secondary | ICD-10-CM | POA: Diagnosis not present

## 2020-01-21 DIAGNOSIS — M8589 Other specified disorders of bone density and structure, multiple sites: Secondary | ICD-10-CM | POA: Diagnosis not present

## 2020-03-18 ENCOUNTER — Encounter (HOSPITAL_COMMUNITY): Payer: Self-pay

## 2020-03-18 ENCOUNTER — Other Ambulatory Visit: Payer: Self-pay

## 2020-03-18 ENCOUNTER — Ambulatory Visit (INDEPENDENT_AMBULATORY_CARE_PROVIDER_SITE_OTHER): Payer: Medicare HMO

## 2020-03-18 ENCOUNTER — Ambulatory Visit (HOSPITAL_COMMUNITY)
Admission: EM | Admit: 2020-03-18 | Discharge: 2020-03-18 | Disposition: A | Discharge status: Home / Self Care | Payer: Medicare HMO | Attending: Physician Assistant | Admitting: Physician Assistant

## 2020-03-18 DIAGNOSIS — M546 Pain in thoracic spine: Secondary | ICD-10-CM

## 2020-03-18 DIAGNOSIS — M549 Dorsalgia, unspecified: Secondary | ICD-10-CM | POA: Diagnosis not present

## 2020-03-18 MED ORDER — TIZANIDINE HCL 2 MG PO CAPS: 2.0000 mg | ORAL_CAPSULE | Freq: Every day | ORAL | 0 refills | Status: AC

## 2020-03-18 MED ORDER — ACETAMINOPHEN 325 MG PO TABS: 650.0000 mg | ORAL_TABLET | Freq: Four times a day (QID) | ORAL | 0 refills | Status: DC | PRN

## 2020-03-18 MED ORDER — DICLOFENAC SODIUM 1 % EX GEL: 4.0000 g | Freq: Four times a day (QID) | CUTANEOUS | 0 refills | Status: AC

## 2020-03-18 NOTE — ED Triage Notes (Signed)
C/o left sided back pain right under shoulder blade. Hx HTN and hyperlipidemia.

## 2020-03-18 NOTE — ED Provider Notes (Signed)
[place]    CSN: [id number] Arrival date & time: 03/18/20  1720      History   Chief Complaint Chief Complaint  Patient presents with  . Back Pain    HPI Regina Poole is a 69 y.o. female.   Patient presents for 1 day history of left sided upper back pain.  She reports this pain started yesterday mostly out of nowhere.  She reports it hurts with certain movements.  She reports still did not hurt very much.  She reports if he twists or bends it hurts fairly significantly.  She reports raising her arm overhead makes the pain worse.  She denies any chest pain, shortness of breath.  She has not any nausea or sweating.  She does report if she takes a deep breath occasionally it will hurt.  She reports touching it makes it hurt.  Denies falls or trauma.  She has never had pain in this area before.   On initial triage patient was found to be tachycardic to 150, however denied any heart palpitations.  Recheck showed normal heart rate.     Past Medical History:  Diagnosis Date  . Arthritis   . High cholesterol   . Hypertension   . Osteoporosis   . Pneumonia     There are no problems to display for this patient.   History reviewed. No pertinent surgical history.  OB History   No obstetric history on file.      Home Medications    Prior to Admission medications   Medication Sig Start Date End Date Taking? Authorizing Provider  acetaminophen (TYLENOL) 325 MG tablet Take 2 tablets (650 mg total) by mouth every 6 (six) hours as needed. 03/18/20   Brazen Domangue, Marguerita Beards, PA-C  albuterol (PROAIR HFA) 108 (90 BASE) MCG/ACT inhaler Inhale 2 puffs into the lungs 2 (two) times daily. Wheezing    [provider]  benazepril-hydrochlorthiazide (LOTENSIN HCT) 20-12.5 MG per tablet Take 1 tablet by mouth daily.    [provider]  diclofenac Sodium (VOLTAREN) 1 % GEL Apply 4 g topically 4 (four) times daily. 03/18/20   Santanna Whitford, Marguerita Beards, PA-C  fluticasone-salmeterol  (ADVAIR HFA) [phone] MCG/ACT inhaler Inhale 2 puffs into the lungs 2 (two) times daily.    [provider]  HYDROcodone-acetaminophen (NORCO/VICODIN) 5-325 MG per tablet Take 2 tablets by mouth every 4 (four) hours as needed for pain. 01/29/13   Rosana Hoes, MD  Hydrocodone-Homatropine (HYDROMET PO) Take 5 mLs by mouth every 6 (six) hours. Cough    [provider]  ibuprofen (ADVIL,MOTRIN) 600 MG tablet Take 1 tablet (600 mg total) by mouth every 8 (eight) hours as needed for pain. 01/29/13   Rosana Hoes, MD  ibuprofen (ADVIL,MOTRIN) 600 MG tablet Take 1 tablet (600 mg total) by mouth every 8 (eight) hours as needed for mild pain or moderate pain. 03/30/14   Presson, Audelia Hives, PA  levofloxacin (LEVAQUIN) 500 MG tablet Take 500 mg by mouth daily. Pt finished on Friday 12-17-11 for 7 day therapy    [provider]  metaxalone (SKELAXIN) 400 MG tablet Take 1 tablet (400 mg total) by mouth 2 (two) times daily as needed for muscle spasms. 03/30/14   Presson, Audelia Hives, PA  Multiple Vitamins-Minerals (MULTIVITAMIN WITH MINERALS) tablet Take 1 tablet by mouth daily.    [provider]  predniSONE (DELTASONE) 20 MG tablet Take 20 mg by mouth daily. Finished therapy on Friday ( 12-17-11 ) for 7  day therapy    [provider]  simvastatin (ZOCOR) 20 MG tablet Take 20 mg by mouth every evening.    [provider]  tizanidine (ZANAFLEX) 2 MG capsule Take 1 capsule (2 mg total) by mouth at bedtime for 7 days. 03/18/20 03/25/20  Jaquayla Hege, Marguerita Beards, PA-C    Family History No family history on file.  Social History Social History   Tobacco Use  . Smoking status: Never Smoker  . Smokeless tobacco: Never Used  Substance Use Topics  . Alcohol use: No  . Drug use: No     Allergies   Azithromycin   Review of Systems Review of Systems   Physical Exam Triage Vital Signs ED Triage Vitals  Enc Vitals Group     BP 03/18/20 1821 (!) 156/76     Pulse Rate  03/18/20 1821 (!) 151     Resp 03/18/20 1821 (!) 22     Temp 03/18/20 1821 98.2 F (36.8 C)     Temp Source 03/18/20 1821 Oral     SpO2 03/18/20 1821 100 %     Weight --      Height --      Head Circumference --      Peak Flow --      Pain Score 03/18/20 1820 9     Pain Loc --      Pain Edu? --      Excl. in [place]? --    No data found.  Updated Vital Signs BP (!) 145/83 (BP Location: Left Arm)   Pulse 78   Temp 98.2 F (36.8 C) (Oral)   Resp (!) 21   SpO2 100%    Provider recheck following triage : pulse rate, pulse 70-80s.  Respiratory rate within normal limits at 18.    Visual Acuity Right Eye Distance:   Left Eye Distance:   Bilateral Distance:    Right Eye Near:   Left Eye Near:    Bilateral Near:     Physical Exam Vitals and nursing note reviewed.  Constitutional:      General: She is not in acute distress.    Appearance: Normal appearance. She is well-developed. She is not ill-appearing or diaphoretic.  HENT:     Head: Normocephalic and atraumatic.  Eyes:     Conjunctiva/sclera: Conjunctivae normal.  Cardiovascular:     Rate and Rhythm: Normal rate and regular rhythm.     Heart sounds: No murmur.  Pulmonary:     Effort: Pulmonary effort is normal. No respiratory distress.     Breath sounds: Normal breath sounds.  Abdominal:     Palpations: Abdomen is soft.     Tenderness: There is no abdominal tenderness.  Musculoskeletal:     Cervical back: Neck supple.     Comments: Tenderness to palpation just inferior to the left scapula in the paraspinal musculature.  There does feel to be a knot and tightness in this area.  Pain elicited with twisting and forward flexion.  Skin:    General: Skin is warm and dry.  Neurological:     General: No focal deficit present.     Mental Status: She is alert and oriented to person, place, and time.      UC Treatments / Results  Labs (all labs ordered are listed, but only abnormal results are displayed) Labs Reviewed -  No data to display  EKG Normal sinus rhythm ventricular rate of 77.  No ST elevation.  No arrhythmia.  Similar in  appearance to EMS EKG in 2013.  Normal EKG.  Radiology DG Chest 2 View  Result Date: 03/18/2020 CLINICAL DATA:  69 year old female with back pain. History of hypertension. EXAM: CHEST - 2 VIEW COMPARISON:  Chest radiograph dated 12/21/2011 FINDINGS: No focal consolidation, pleural effusion, pneumothorax. The cardiac silhouette is within normal limits. No acute osseous pathology. IMPRESSION: No active cardiopulmonary disease. Electronically Signed   By: Anner Crete M.D.   On: 03/18/2020 19:49    Procedures Procedures (including critical care time)  Medications Ordered in UC Medications - No data to display  Initial Impression / Assessment and Plan / UC Course  I have reviewed the triage vital signs and the nursing notes.  Pertinent labs & imaging results that were available during my care of the patient were reviewed by me and considered in my medical decision making (see chart for details).     #Acute left-sided thoracic back pain Patient 69 year old female presents for acute left-sided thoracic back pain.  Patient was found to be tach cardiac on initial triage at 150.  EKG showed sinus rhythm rate of 77.  All repeat vital signs heart rate between 70 and 80.  All regular rhythms.  No chest pain or shortness of breath throughout the duration of these symptoms and visit.  Chest x-ray obtained without pathology.  Given she has very reproducible exam, normal vital signs now, believe this is likely musculoskeletal as opposed to cardiac or intrathoracic.  We will treat with Tylenol, low-dose Zanaflex and topical Voltaren.  Strict return and emergency department precautions were discussed.  Patient to follow-up with primary care for discussion of possible short duration of tachycardia today.  Patient and daughter verbalized understanding the plan. Final Clinical Impressions(s) / UC  Diagnoses   Final diagnoses:  Acute left-sided thoracic back pain     Discharge Instructions     Take 2 regular strength Tylenol every 6 hours He may take the Zanaflex 2 mg tablet at night, this will make you sleepy so do not drive or drink alcohol after taking Apply the Voltaren to the area of pain up to 4 times a day  Schedule follow-up with your primary care for reassessment  If your back pain radiates in your chest, you have shortness of breath lightheaded or pain becomes significantly worse please report to the emergency department for reevaluation      ED Prescriptions    Medication Sig Dispense Auth. Provider   acetaminophen (TYLENOL) 325 MG tablet Take 2 tablets (650 mg total) by mouth every 6 (six) hours as needed. 30 tablet Oron Westrup, Marguerita Beards, PA-C   diclofenac Sodium (VOLTAREN) 1 % GEL Apply 4 g topically 4 (four) times daily. 100 g Vickye Astorino, Marguerita Beards, PA-C   tizanidine (ZANAFLEX) 2 MG capsule Take 1 capsule (2 mg total) by mouth at bedtime for 7 days. 7 capsule Kanton Kamel, Marguerita Beards, PA-C     PDMP not reviewed this encounter.   Purnell Shoemaker, PA-C 03/18/20 2002

## 2020-03-18 NOTE — Discharge Instructions (Signed)
Take 2 regular strength Tylenol every 6 hours He may take the Zanaflex 2 mg tablet at night, this will make you sleepy so do not drive or drink alcohol after taking Apply the Voltaren to the area of pain up to 4 times a day  Schedule follow-up with your primary care for reassessment  If your back pain radiates in your chest, you have shortness of breath lightheaded or pain becomes significantly worse please report to the emergency department for reevaluation

## 2020-03-19 DIAGNOSIS — E669 Obesity, unspecified: Secondary | ICD-10-CM | POA: Diagnosis not present

## 2020-03-19 DIAGNOSIS — R000 Tachycardia, unspecified: Secondary | ICD-10-CM | POA: Diagnosis not present

## 2020-03-19 DIAGNOSIS — I1310 Hypertensive heart and chronic kidney disease without heart failure, with stage 1 through stage 4 chronic kidney disease, or unspecified chronic kidney disease: Secondary | ICD-10-CM | POA: Diagnosis not present

## 2020-03-19 DIAGNOSIS — M5416 Radiculopathy, lumbar region: Secondary | ICD-10-CM | POA: Diagnosis not present

## 2020-03-19 DIAGNOSIS — N1831 Chronic kidney disease, stage 3a: Secondary | ICD-10-CM | POA: Diagnosis not present

## 2020-03-19 DIAGNOSIS — M546 Pain in thoracic spine: Secondary | ICD-10-CM | POA: Diagnosis not present

## 2020-04-09 DIAGNOSIS — H5203 Hypermetropia, bilateral: Secondary | ICD-10-CM | POA: Diagnosis not present

## 2020-04-09 DIAGNOSIS — H2513 Age-related nuclear cataract, bilateral: Secondary | ICD-10-CM | POA: Diagnosis not present

## 2020-05-05 DIAGNOSIS — M1990 Unspecified osteoarthritis, unspecified site: Secondary | ICD-10-CM | POA: Diagnosis not present

## 2020-05-05 DIAGNOSIS — M546 Pain in thoracic spine: Secondary | ICD-10-CM | POA: Diagnosis not present

## 2020-05-05 DIAGNOSIS — E669 Obesity, unspecified: Secondary | ICD-10-CM | POA: Diagnosis not present

## 2020-05-05 DIAGNOSIS — I1310 Hypertensive heart and chronic kidney disease without heart failure, with stage 1 through stage 4 chronic kidney disease, or unspecified chronic kidney disease: Secondary | ICD-10-CM | POA: Diagnosis not present

## 2020-05-05 DIAGNOSIS — N1831 Chronic kidney disease, stage 3a: Secondary | ICD-10-CM | POA: Diagnosis not present

## 2020-05-05 DIAGNOSIS — R000 Tachycardia, unspecified: Secondary | ICD-10-CM | POA: Diagnosis not present

## 2020-08-02 DIAGNOSIS — Z23 Encounter for immunization: Secondary | ICD-10-CM | POA: Diagnosis not present

## 2020-08-16 DIAGNOSIS — Z1211 Encounter for screening for malignant neoplasm of colon: Secondary | ICD-10-CM | POA: Diagnosis not present

## 2020-11-05 DIAGNOSIS — E785 Hyperlipidemia, unspecified: Secondary | ICD-10-CM | POA: Diagnosis not present

## 2020-11-05 DIAGNOSIS — M859 Disorder of bone density and structure, unspecified: Secondary | ICD-10-CM | POA: Diagnosis not present

## 2020-11-12 DIAGNOSIS — E785 Hyperlipidemia, unspecified: Secondary | ICD-10-CM | POA: Diagnosis not present

## 2020-11-12 DIAGNOSIS — R000 Tachycardia, unspecified: Secondary | ICD-10-CM | POA: Diagnosis not present

## 2020-11-12 DIAGNOSIS — Z1212 Encounter for screening for malignant neoplasm of rectum: Secondary | ICD-10-CM | POA: Diagnosis not present

## 2020-11-12 DIAGNOSIS — Z1331 Encounter for screening for depression: Secondary | ICD-10-CM | POA: Diagnosis not present

## 2020-11-12 DIAGNOSIS — Z0000 Encounter for general adult medical examination without abnormal findings: Secondary | ICD-10-CM | POA: Diagnosis not present

## 2020-11-12 DIAGNOSIS — R82998 Other abnormal findings in urine: Secondary | ICD-10-CM | POA: Diagnosis not present

## 2020-11-12 DIAGNOSIS — N1831 Chronic kidney disease, stage 3a: Secondary | ICD-10-CM | POA: Diagnosis not present

## 2020-11-12 DIAGNOSIS — E669 Obesity, unspecified: Secondary | ICD-10-CM | POA: Diagnosis not present

## 2020-11-12 DIAGNOSIS — J45909 Unspecified asthma, uncomplicated: Secondary | ICD-10-CM | POA: Diagnosis not present

## 2020-11-12 DIAGNOSIS — I1310 Hypertensive heart and chronic kidney disease without heart failure, with stage 1 through stage 4 chronic kidney disease, or unspecified chronic kidney disease: Secondary | ICD-10-CM | POA: Diagnosis not present

## 2020-11-12 DIAGNOSIS — J302 Other seasonal allergic rhinitis: Secondary | ICD-10-CM | POA: Diagnosis not present

## 2020-11-12 DIAGNOSIS — M8580 Other specified disorders of bone density and structure, unspecified site: Secondary | ICD-10-CM | POA: Diagnosis not present

## 2020-11-12 DIAGNOSIS — M1990 Unspecified osteoarthritis, unspecified site: Secondary | ICD-10-CM | POA: Diagnosis not present

## 2020-11-24 IMAGING — MG DIGITAL SCREENING BILAT W/ TOMO W/ CAD
8 series · 8 of 24 positions shown · non-contrast
Comparison: Previous exam(s).

CLINICAL DATA: Screening.

EXAM:
DIGITAL SCREENING BILATERAL MAMMOGRAM WITH TOMO AND CAD

[L MLO synth-2D]
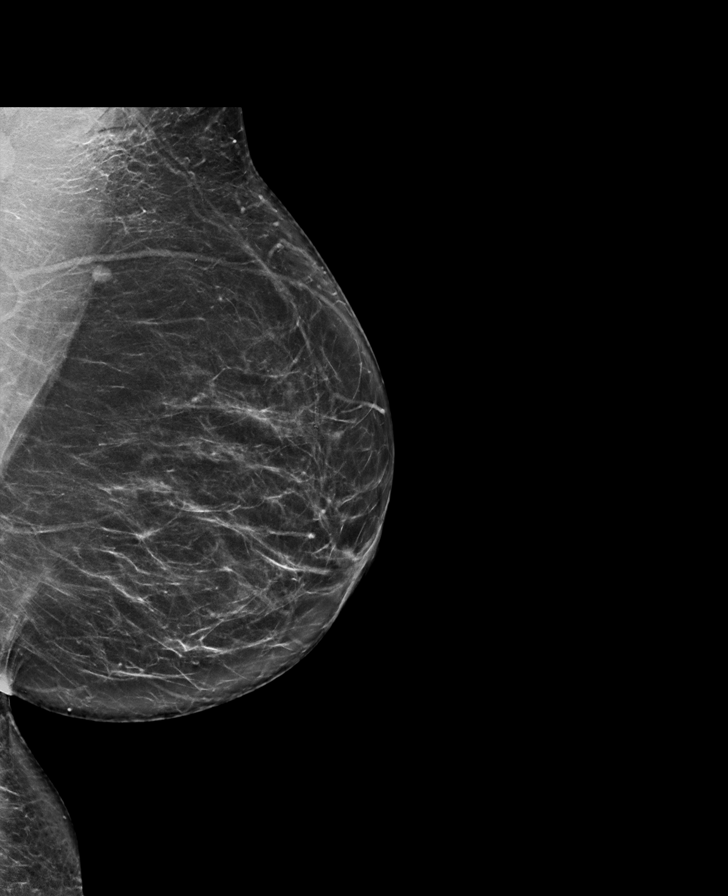

[R CC synth-2D]
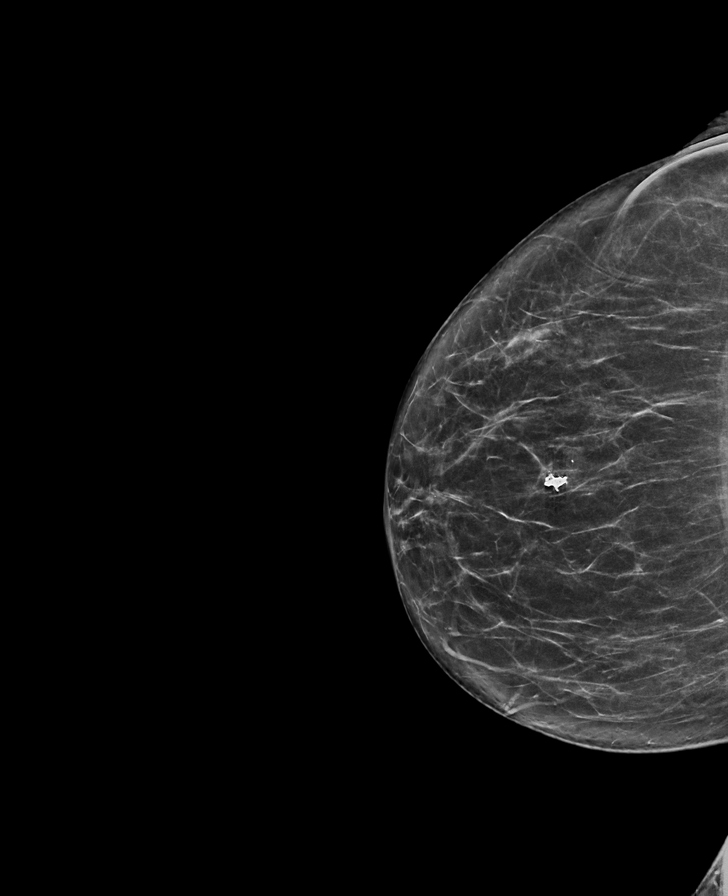

[L CC synth-2D]
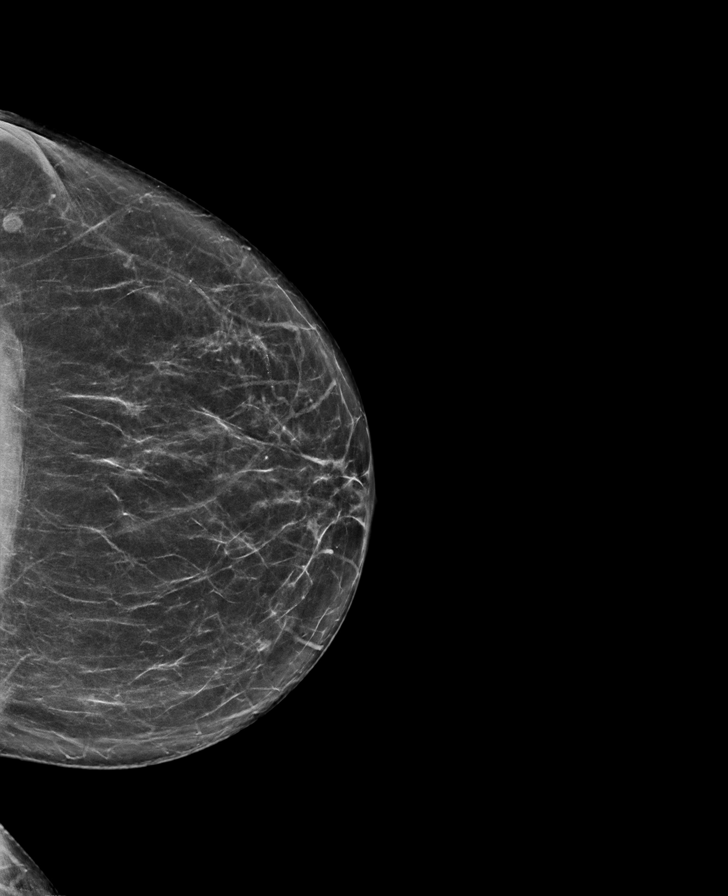

[R MLO synth-2D]
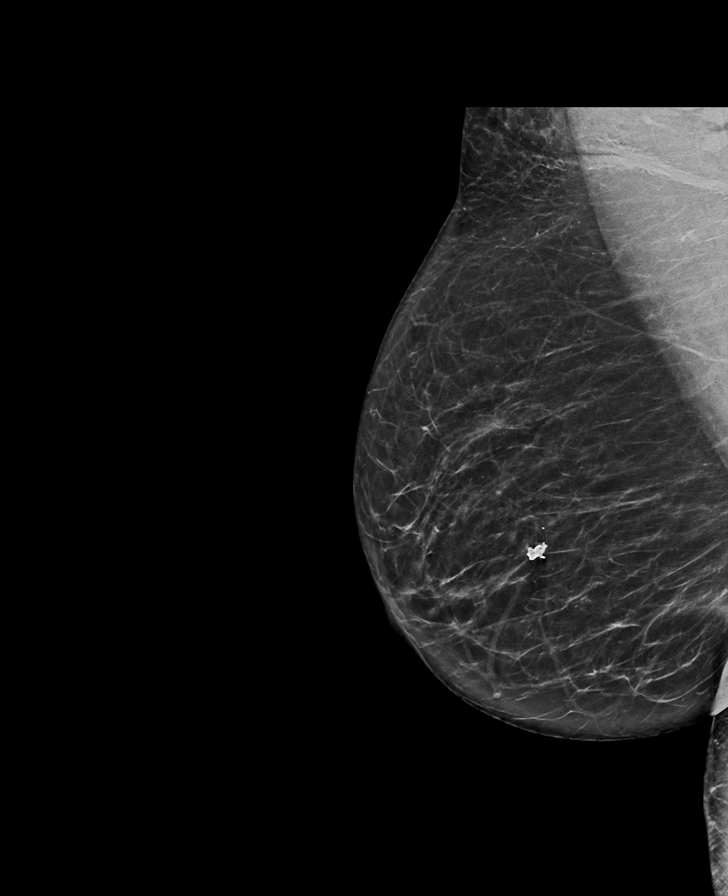

[R CC tomo · tomo slice 35/68.0]
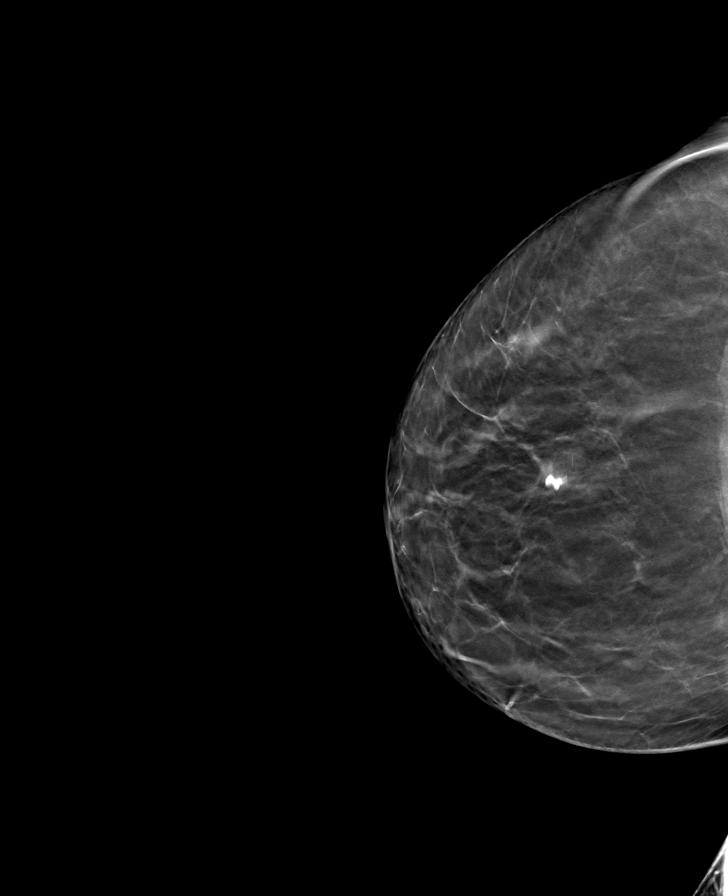

[L MLO tomo · tomo slice 39/76.0]
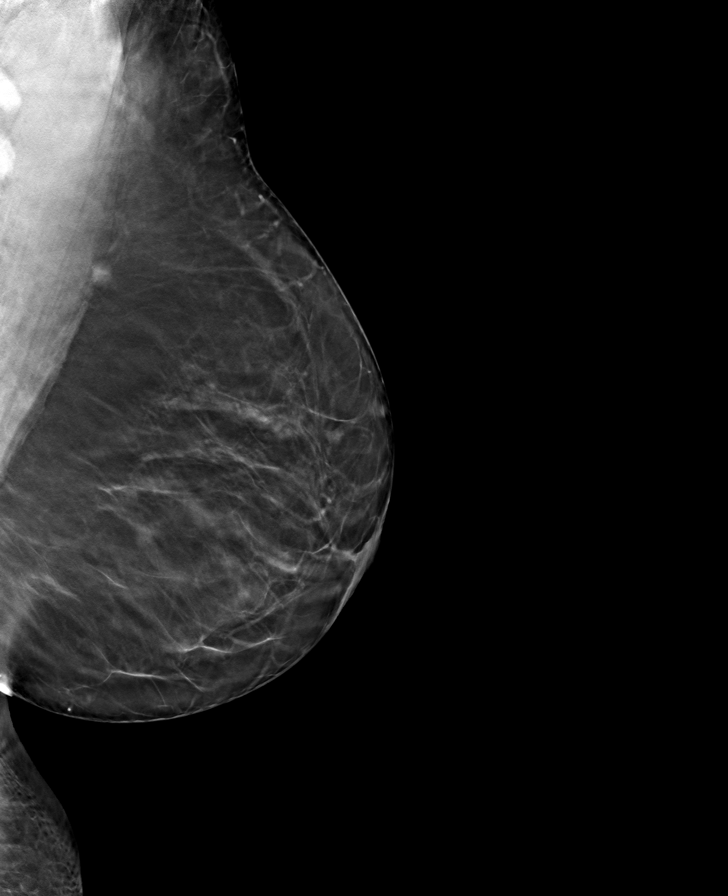

[R MLO tomo · tomo slice 37/72.0]
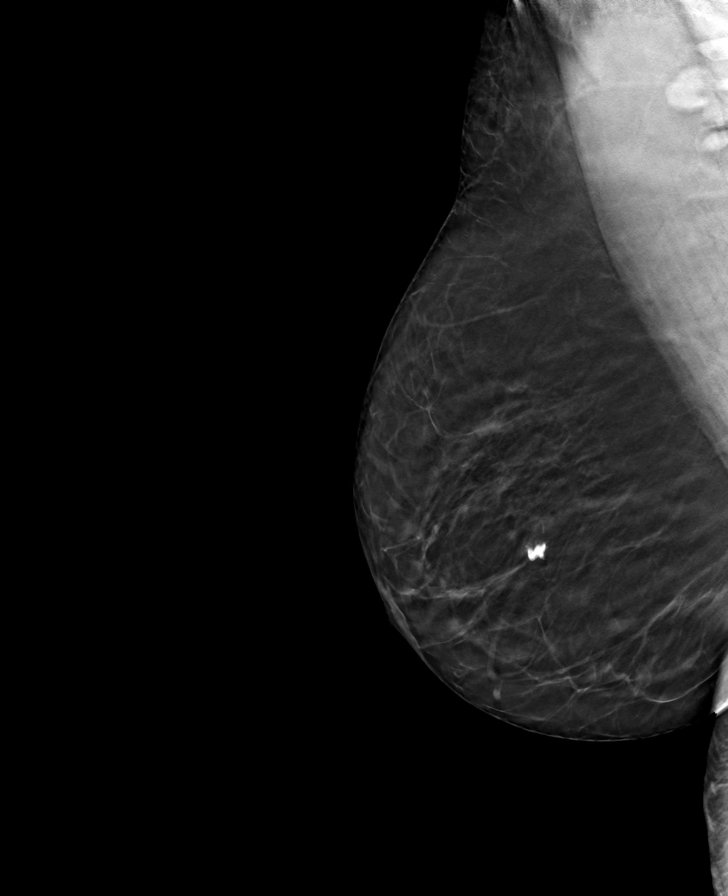

[L CC tomo · tomo slice 35/68.0]
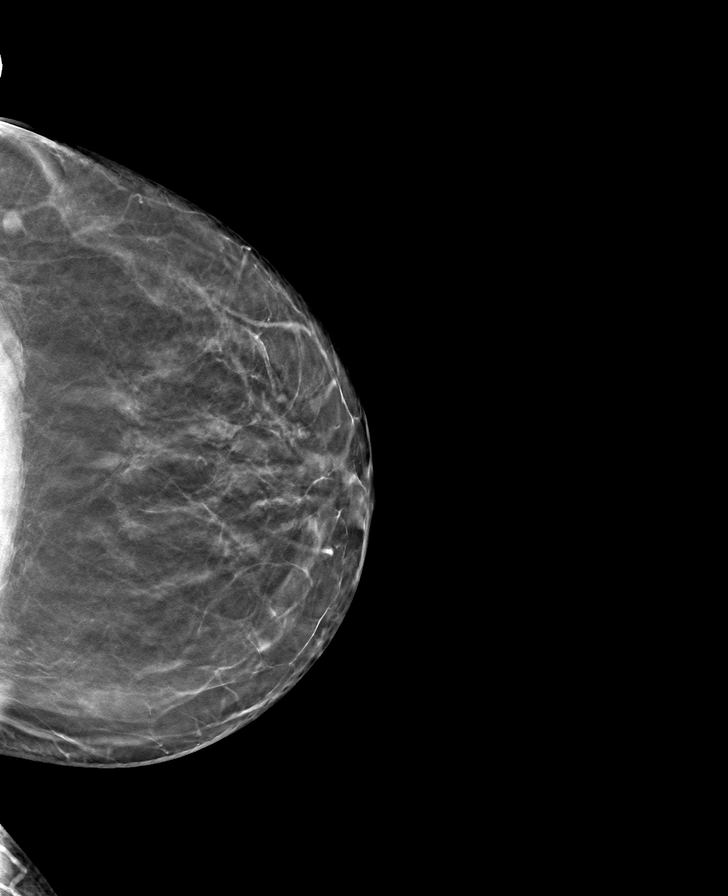

[8 of 24 positions shown; findings below may reference images not displayed]

ACR Breast Density Category b: There are scattered areas of
fibroglandular density.
FINDINGS: There are no findings suspicious for malignancy. Images were
processed with CAD.
IMPRESSION: No mammographic evidence of malignancy. A result letter of this
screening mammogram will be mailed directly to the patient.

RECOMMENDATION:
Screening mammogram in one year. (Code:[id number])

BI-RADS CATEGORY  1: Negative.

## 2020-12-15 ENCOUNTER — Other Ambulatory Visit: Payer: Self-pay | Admitting: Internal Medicine

## 2020-12-15 DIAGNOSIS — Z1231 Encounter for screening mammogram for malignant neoplasm of breast: Secondary | ICD-10-CM

## 2021-02-04 ENCOUNTER — Inpatient Hospital Stay: Admission: RE | Admit: 2021-02-04 | Payer: Medicare HMO | Source: Ambulatory Visit

## 2021-02-05 ENCOUNTER — Other Ambulatory Visit: Payer: Self-pay

## 2021-02-05 ENCOUNTER — Ambulatory Visit
Admission: RE | Admit: 2021-02-05 | Discharge: 2021-02-05 | Disposition: A | Discharge status: Home / Self Care | Payer: Medicare HMO | Source: Ambulatory Visit | Attending: Internal Medicine | Admitting: Internal Medicine

## 2021-02-05 DIAGNOSIS — Z1231 Encounter for screening mammogram for malignant neoplasm of breast: Secondary | ICD-10-CM

## 2021-04-13 DIAGNOSIS — H5203 Hypermetropia, bilateral: Secondary | ICD-10-CM | POA: Diagnosis not present

## 2021-04-13 DIAGNOSIS — H524 Presbyopia: Secondary | ICD-10-CM | POA: Diagnosis not present

## 2021-04-13 DIAGNOSIS — H2513 Age-related nuclear cataract, bilateral: Secondary | ICD-10-CM | POA: Diagnosis not present

## 2021-04-13 DIAGNOSIS — H25041 Posterior subcapsular polar age-related cataract, right eye: Secondary | ICD-10-CM | POA: Diagnosis not present

## 2021-05-19 DIAGNOSIS — J45909 Unspecified asthma, uncomplicated: Secondary | ICD-10-CM | POA: Diagnosis not present

## 2021-05-19 DIAGNOSIS — M8580 Other specified disorders of bone density and structure, unspecified site: Secondary | ICD-10-CM | POA: Diagnosis not present

## 2021-05-19 DIAGNOSIS — K219 Gastro-esophageal reflux disease without esophagitis: Secondary | ICD-10-CM | POA: Diagnosis not present

## 2021-05-19 DIAGNOSIS — R000 Tachycardia, unspecified: Secondary | ICD-10-CM | POA: Diagnosis not present

## 2021-05-19 DIAGNOSIS — N1831 Chronic kidney disease, stage 3a: Secondary | ICD-10-CM | POA: Diagnosis not present

## 2021-05-19 DIAGNOSIS — J302 Other seasonal allergic rhinitis: Secondary | ICD-10-CM | POA: Diagnosis not present

## 2021-05-19 DIAGNOSIS — M5416 Radiculopathy, lumbar region: Secondary | ICD-10-CM | POA: Diagnosis not present

## 2021-05-19 DIAGNOSIS — M1990 Unspecified osteoarthritis, unspecified site: Secondary | ICD-10-CM | POA: Diagnosis not present

## 2021-05-19 DIAGNOSIS — E669 Obesity, unspecified: Secondary | ICD-10-CM | POA: Diagnosis not present

## 2021-05-19 DIAGNOSIS — I1310 Hypertensive heart and chronic kidney disease without heart failure, with stage 1 through stage 4 chronic kidney disease, or unspecified chronic kidney disease: Secondary | ICD-10-CM | POA: Diagnosis not present

## 2021-07-21 DIAGNOSIS — E785 Hyperlipidemia, unspecified: Secondary | ICD-10-CM | POA: Diagnosis not present

## 2021-11-27 DIAGNOSIS — E785 Hyperlipidemia, unspecified: Secondary | ICD-10-CM | POA: Diagnosis not present

## 2021-11-27 DIAGNOSIS — M859 Disorder of bone density and structure, unspecified: Secondary | ICD-10-CM | POA: Diagnosis not present

## 2021-12-18 DIAGNOSIS — E785 Hyperlipidemia, unspecified: Secondary | ICD-10-CM | POA: Diagnosis not present

## 2021-12-18 DIAGNOSIS — M1990 Unspecified osteoarthritis, unspecified site: Secondary | ICD-10-CM | POA: Diagnosis not present

## 2021-12-18 DIAGNOSIS — M8580 Other specified disorders of bone density and structure, unspecified site: Secondary | ICD-10-CM | POA: Diagnosis not present

## 2021-12-18 DIAGNOSIS — N1831 Chronic kidney disease, stage 3a: Secondary | ICD-10-CM | POA: Diagnosis not present

## 2021-12-18 DIAGNOSIS — E669 Obesity, unspecified: Secondary | ICD-10-CM | POA: Diagnosis not present

## 2021-12-18 DIAGNOSIS — Z0000 Encounter for general adult medical examination without abnormal findings: Secondary | ICD-10-CM | POA: Diagnosis not present

## 2021-12-18 DIAGNOSIS — R000 Tachycardia, unspecified: Secondary | ICD-10-CM | POA: Diagnosis not present

## 2021-12-18 DIAGNOSIS — K219 Gastro-esophageal reflux disease without esophagitis: Secondary | ICD-10-CM | POA: Diagnosis not present

## 2021-12-18 DIAGNOSIS — M5416 Radiculopathy, lumbar region: Secondary | ICD-10-CM | POA: Diagnosis not present

## 2021-12-18 DIAGNOSIS — I1310 Hypertensive heart and chronic kidney disease without heart failure, with stage 1 through stage 4 chronic kidney disease, or unspecified chronic kidney disease: Secondary | ICD-10-CM | POA: Diagnosis not present

## 2021-12-23 ENCOUNTER — Other Ambulatory Visit: Payer: Self-pay | Admitting: Internal Medicine

## 2021-12-23 DIAGNOSIS — Z1231 Encounter for screening mammogram for malignant neoplasm of breast: Secondary | ICD-10-CM

## 2021-12-26 DIAGNOSIS — Z1211 Encounter for screening for malignant neoplasm of colon: Secondary | ICD-10-CM | POA: Diagnosis not present

## 2021-12-28 ENCOUNTER — Other Ambulatory Visit: Payer: Self-pay | Admitting: Internal Medicine

## 2021-12-28 DIAGNOSIS — Z1231 Encounter for screening mammogram for malignant neoplasm of breast: Secondary | ICD-10-CM

## 2022-01-15 DIAGNOSIS — N1831 Chronic kidney disease, stage 3a: Secondary | ICD-10-CM | POA: Diagnosis not present

## 2022-01-15 DIAGNOSIS — D72819 Decreased white blood cell count, unspecified: Secondary | ICD-10-CM | POA: Diagnosis not present

## 2022-01-15 DIAGNOSIS — I1310 Hypertensive heart and chronic kidney disease without heart failure, with stage 1 through stage 4 chronic kidney disease, or unspecified chronic kidney disease: Secondary | ICD-10-CM | POA: Diagnosis not present

## 2022-02-08 ENCOUNTER — Ambulatory Visit
Admission: RE | Admit: 2022-02-08 | Discharge: 2022-02-08 | Disposition: A | Discharge status: Home / Self Care | Payer: Medicare HMO | Source: Ambulatory Visit | Attending: Internal Medicine | Admitting: Internal Medicine

## 2022-02-08 DIAGNOSIS — M8589 Other specified disorders of bone density and structure, multiple sites: Secondary | ICD-10-CM | POA: Diagnosis not present

## 2022-02-08 DIAGNOSIS — Z1231 Encounter for screening mammogram for malignant neoplasm of breast: Secondary | ICD-10-CM

## 2022-02-08 DIAGNOSIS — I1310 Hypertensive heart and chronic kidney disease without heart failure, with stage 1 through stage 4 chronic kidney disease, or unspecified chronic kidney disease: Secondary | ICD-10-CM | POA: Diagnosis not present

## 2022-02-08 DIAGNOSIS — N1831 Chronic kidney disease, stage 3a: Secondary | ICD-10-CM | POA: Diagnosis not present

## 2022-03-08 DIAGNOSIS — I1310 Hypertensive heart and chronic kidney disease without heart failure, with stage 1 through stage 4 chronic kidney disease, or unspecified chronic kidney disease: Secondary | ICD-10-CM | POA: Diagnosis not present

## 2022-03-08 DIAGNOSIS — N1831 Chronic kidney disease, stage 3a: Secondary | ICD-10-CM | POA: Diagnosis not present

## 2022-04-08 DIAGNOSIS — I1310 Hypertensive heart and chronic kidney disease without heart failure, with stage 1 through stage 4 chronic kidney disease, or unspecified chronic kidney disease: Secondary | ICD-10-CM | POA: Diagnosis not present

## 2022-04-08 DIAGNOSIS — N1831 Chronic kidney disease, stage 3a: Secondary | ICD-10-CM | POA: Diagnosis not present

## 2022-04-14 DIAGNOSIS — I1310 Hypertensive heart and chronic kidney disease without heart failure, with stage 1 through stage 4 chronic kidney disease, or unspecified chronic kidney disease: Secondary | ICD-10-CM | POA: Diagnosis not present

## 2022-04-14 DIAGNOSIS — N1831 Chronic kidney disease, stage 3a: Secondary | ICD-10-CM | POA: Diagnosis not present

## 2022-04-16 DIAGNOSIS — H5203 Hypermetropia, bilateral: Secondary | ICD-10-CM | POA: Diagnosis not present

## 2022-04-16 DIAGNOSIS — H2513 Age-related nuclear cataract, bilateral: Secondary | ICD-10-CM | POA: Diagnosis not present

## 2022-04-16 DIAGNOSIS — H524 Presbyopia: Secondary | ICD-10-CM | POA: Diagnosis not present

## 2022-06-25 DIAGNOSIS — M5416 Radiculopathy, lumbar region: Secondary | ICD-10-CM | POA: Diagnosis not present

## 2022-06-25 DIAGNOSIS — I1310 Hypertensive heart and chronic kidney disease without heart failure, with stage 1 through stage 4 chronic kidney disease, or unspecified chronic kidney disease: Secondary | ICD-10-CM | POA: Diagnosis not present

## 2022-06-25 DIAGNOSIS — M1990 Unspecified osteoarthritis, unspecified site: Secondary | ICD-10-CM | POA: Diagnosis not present

## 2022-06-25 DIAGNOSIS — R000 Tachycardia, unspecified: Secondary | ICD-10-CM | POA: Diagnosis not present

## 2022-06-25 DIAGNOSIS — Z23 Encounter for immunization: Secondary | ICD-10-CM | POA: Diagnosis not present

## 2022-06-25 DIAGNOSIS — E785 Hyperlipidemia, unspecified: Secondary | ICD-10-CM | POA: Diagnosis not present

## 2022-06-25 DIAGNOSIS — J45909 Unspecified asthma, uncomplicated: Secondary | ICD-10-CM | POA: Diagnosis not present

## 2022-06-25 DIAGNOSIS — J302 Other seasonal allergic rhinitis: Secondary | ICD-10-CM | POA: Diagnosis not present

## 2022-06-25 DIAGNOSIS — K219 Gastro-esophageal reflux disease without esophagitis: Secondary | ICD-10-CM | POA: Diagnosis not present

## 2022-06-25 DIAGNOSIS — N1831 Chronic kidney disease, stage 3a: Secondary | ICD-10-CM | POA: Diagnosis not present

## 2022-06-25 DIAGNOSIS — E669 Obesity, unspecified: Secondary | ICD-10-CM | POA: Diagnosis not present

## 2022-06-25 DIAGNOSIS — M8580 Other specified disorders of bone density and structure, unspecified site: Secondary | ICD-10-CM | POA: Diagnosis not present

## 2022-07-15 ENCOUNTER — Other Ambulatory Visit: Payer: Self-pay | Admitting: Internal Medicine

## 2022-07-15 DIAGNOSIS — N1831 Chronic kidney disease, stage 3a: Secondary | ICD-10-CM

## 2022-07-15 DIAGNOSIS — I1310 Hypertensive heart and chronic kidney disease without heart failure, with stage 1 through stage 4 chronic kidney disease, or unspecified chronic kidney disease: Secondary | ICD-10-CM

## 2022-07-30 ENCOUNTER — Ambulatory Visit
Admission: RE | Admit: 2022-07-30 | Discharge: 2022-07-30 | Disposition: A | Discharge status: Home / Self Care | Payer: Medicare HMO | Source: Ambulatory Visit | Attending: Internal Medicine | Admitting: Internal Medicine

## 2022-07-30 DIAGNOSIS — I1310 Hypertensive heart and chronic kidney disease without heart failure, with stage 1 through stage 4 chronic kidney disease, or unspecified chronic kidney disease: Secondary | ICD-10-CM

## 2022-07-30 DIAGNOSIS — N189 Chronic kidney disease, unspecified: Secondary | ICD-10-CM | POA: Diagnosis not present

## 2022-07-30 DIAGNOSIS — N1831 Chronic kidney disease, stage 3a: Secondary | ICD-10-CM

## 2023-01-05 ENCOUNTER — Other Ambulatory Visit: Payer: Self-pay | Admitting: Internal Medicine

## 2023-01-05 DIAGNOSIS — Z0000 Encounter for general adult medical examination without abnormal findings: Secondary | ICD-10-CM

## 2023-01-17 DIAGNOSIS — E785 Hyperlipidemia, unspecified: Secondary | ICD-10-CM | POA: Diagnosis not present

## 2023-01-17 DIAGNOSIS — N1831 Chronic kidney disease, stage 3a: Secondary | ICD-10-CM | POA: Diagnosis not present

## 2023-01-17 DIAGNOSIS — Z7689 Persons encountering health services in other specified circumstances: Secondary | ICD-10-CM | POA: Diagnosis not present

## 2023-01-17 DIAGNOSIS — K219 Gastro-esophageal reflux disease without esophagitis: Secondary | ICD-10-CM | POA: Diagnosis not present

## 2023-01-17 DIAGNOSIS — M8580 Other specified disorders of bone density and structure, unspecified site: Secondary | ICD-10-CM | POA: Diagnosis not present

## 2023-01-24 DIAGNOSIS — E669 Obesity, unspecified: Secondary | ICD-10-CM | POA: Diagnosis not present

## 2023-01-24 DIAGNOSIS — E785 Hyperlipidemia, unspecified: Secondary | ICD-10-CM | POA: Diagnosis not present

## 2023-01-24 DIAGNOSIS — R82998 Other abnormal findings in urine: Secondary | ICD-10-CM | POA: Diagnosis not present

## 2023-01-24 DIAGNOSIS — J45909 Unspecified asthma, uncomplicated: Secondary | ICD-10-CM | POA: Diagnosis not present

## 2023-01-24 DIAGNOSIS — J302 Other seasonal allergic rhinitis: Secondary | ICD-10-CM | POA: Diagnosis not present

## 2023-01-24 DIAGNOSIS — Z0000 Encounter for general adult medical examination without abnormal findings: Secondary | ICD-10-CM | POA: Diagnosis not present

## 2023-01-24 DIAGNOSIS — Z1339 Encounter for screening examination for other mental health and behavioral disorders: Secondary | ICD-10-CM | POA: Diagnosis not present

## 2023-01-24 DIAGNOSIS — Z1331 Encounter for screening for depression: Secondary | ICD-10-CM | POA: Diagnosis not present

## 2023-01-24 DIAGNOSIS — I1310 Hypertensive heart and chronic kidney disease without heart failure, with stage 1 through stage 4 chronic kidney disease, or unspecified chronic kidney disease: Secondary | ICD-10-CM | POA: Diagnosis not present

## 2023-01-24 DIAGNOSIS — M1990 Unspecified osteoarthritis, unspecified site: Secondary | ICD-10-CM | POA: Diagnosis not present

## 2023-01-24 DIAGNOSIS — N1831 Chronic kidney disease, stage 3a: Secondary | ICD-10-CM | POA: Diagnosis not present

## 2023-01-24 DIAGNOSIS — M8580 Other specified disorders of bone density and structure, unspecified site: Secondary | ICD-10-CM | POA: Diagnosis not present

## 2023-01-24 DIAGNOSIS — D72819 Decreased white blood cell count, unspecified: Secondary | ICD-10-CM | POA: Diagnosis not present

## 2023-02-21 ENCOUNTER — Ambulatory Visit (HOSPITAL_COMMUNITY)
Admission: EM | Admit: 2023-02-21 | Discharge: 2023-02-21 | Disposition: A | Discharge status: Home / Self Care | Payer: Medicare HMO | Attending: Physician Assistant | Admitting: Physician Assistant

## 2023-02-21 ENCOUNTER — Encounter (HOSPITAL_COMMUNITY): Payer: Self-pay

## 2023-02-21 DIAGNOSIS — M545 Low back pain, unspecified: Secondary | ICD-10-CM

## 2023-02-21 MED ORDER — TIZANIDINE HCL 2 MG PO CAPS: 2.0000 mg | ORAL_CAPSULE | Freq: Three times a day (TID) | ORAL | 0 refills | Status: DC

## 2023-02-21 MED ORDER — MELOXICAM 7.5 MG PO TABS: 7.5000 mg | ORAL_TABLET | Freq: Every day | ORAL | 0 refills | Status: DC

## 2023-02-21 NOTE — Discharge Instructions (Addendum)
Return if any problems.

## 2023-02-21 NOTE — ED Triage Notes (Signed)
Here for lower back pain that started last night. Denies any falls or trauma.

## 2023-02-21 NOTE — ED Provider Notes (Signed)
[facility]    CSN: [id number] Arrival date & time: 02/21/23  0802      History   Chief Complaint Chief Complaint  Patient presents with   Back Pain    HPI Regina Poole is a 72 y.o. female.   Pt complains of low back pain.     The history is provided by the patient. No language interpreter was used.  Back Pain Location:  Generalized Quality:  Aching Pain severity:  No pain Onset quality:  Gradual Timing:  Constant Progression:  Worsening Chronicity:  New Relieved by:  Nothing Worsened by:  Nothing Ineffective treatments:  None tried   Past Medical History:  Diagnosis Date   Arthritis    High cholesterol    Hypertension    Osteoporosis    Pneumonia     There are no problems to display for this patient.   History reviewed. No pertinent surgical history.  OB History   No obstetric history on file.      Home Medications    Prior to Admission medications   Medication Sig Start Date End Date Taking? Authorizing Provider  acetaminophen (TYLENOL) 325 MG tablet Take 2 tablets (650 mg total) by mouth every 6 (six) hours as needed. 03/18/20   Darr, Gerilyn Pilgrim, PA-C  albuterol (PROAIR HFA) 108 (90 BASE) MCG/ACT inhaler Inhale 2 puffs into the lungs 2 (two) times daily. Wheezing    [provider]  benazepril-hydrochlorthiazide (LOTENSIN HCT) 20-12.5 MG per tablet Take 1 tablet by mouth daily.    [provider]  diclofenac Sodium (VOLTAREN) 1 % GEL Apply 4 g topically 4 (four) times daily. 03/18/20   Darr, Gerilyn Pilgrim, PA-C  fluticasone-salmeterol (ADVAIR HFA) [phone] MCG/ACT inhaler Inhale 2 puffs into the lungs 2 (two) times daily.    [provider]  HYDROcodone-acetaminophen (NORCO/VICODIN) 5-325 MG per tablet Take 2 tablets by mouth every 4 (four) hours as needed for pain. 01/29/13   Jimmie Molly, MD  Hydrocodone-Homatropine (HYDROMET PO) Take 5 mLs by mouth every 6 (six) hours. Cough    [provider]  ibuprofen  (ADVIL,MOTRIN) 600 MG tablet Take 1 tablet (600 mg total) by mouth every 8 (eight) hours as needed for pain. 01/29/13   Jimmie Molly, MD  ibuprofen (ADVIL,MOTRIN) 600 MG tablet Take 1 tablet (600 mg total) by mouth every 8 (eight) hours as needed for mild pain or moderate pain. 03/30/14   Presson, Mathis Fare, PA  levofloxacin (LEVAQUIN) 500 MG tablet Take 500 mg by mouth daily. Pt finished on Friday 12-17-11 for 7 day therapy    [provider]  metaxalone (SKELAXIN) 400 MG tablet Take 1 tablet (400 mg total) by mouth 2 (two) times daily as needed for muscle spasms. 03/30/14   Presson, Mathis Fare, PA  Multiple Vitamins-Minerals (MULTIVITAMIN WITH MINERALS) tablet Take 1 tablet by mouth daily.    [provider]  predniSONE (DELTASONE) 20 MG tablet Take 20 mg by mouth daily. Finished therapy on Friday ( 12-17-11 ) for 7 day therapy    [provider]  simvastatin (ZOCOR) 20 MG tablet Take 20 mg by mouth every evening.    [provider]    Family History History reviewed. No pertinent family history.  Social History Social History   Tobacco Use   Smoking status: Never   Smokeless tobacco: Never  Vaping Use   Vaping Use: Never used  Substance Use Topics   Alcohol use: No   Drug use: No  Allergies   Azithromycin   Review of Systems Review of Systems  Musculoskeletal:  Positive for back pain.  All other systems reviewed and are negative.    Physical Exam Triage Vital Signs ED Triage Vitals  Enc Vitals Group     BP 02/21/23 0824 (!) 181/84     Pulse Rate 02/21/23 0824 83     Resp 02/21/23 0824 16     Temp 02/21/23 0824 98.8 F (37.1 C)     Temp Source 02/21/23 0824 Oral     SpO2 02/21/23 0824 94 %     Weight --      Height --      Head Circumference --      Peak Flow --      Pain Score 02/21/23 0826 10     Pain Loc --      Pain Edu? --      Excl. in [place]? --    No data found.  Updated Vital Signs BP (!) 181/84 (BP Location:  Left Arm)   Pulse 83   Temp 98.8 F (37.1 C) (Oral)   Resp 16   SpO2 94%   Visual Acuity Right Eye Distance:   Left Eye Distance:   Bilateral Distance:    Right Eye Near:   Left Eye Near:    Bilateral Near:     Physical Exam Vitals and nursing note reviewed.  Constitutional:      Appearance: She is well-developed.  HENT:     Head: Normocephalic.  Cardiovascular:     Rate and Rhythm: Normal rate.  Pulmonary:     Effort: Pulmonary effort is normal.  Abdominal:     General: There is no distension.  Musculoskeletal:        General: Normal range of motion.     Cervical back: Normal range of motion.     Comments: Diffusely tender ls spine  no rash.    Skin:    General: Skin is warm.  Neurological:     Mental Status: She is alert and oriented to person, place, and time.  Psychiatric:        Mood and Affect: Mood normal.      UC Treatments / Results  Labs (all labs ordered are listed, but only abnormal results are displayed) Labs Reviewed - No data to display  EKG   Radiology No results found.  Procedures Procedures (including critical care time)  Medications Ordered in UC Medications - No data to display  Initial Impression / Assessment and Plan / UC Course  I have reviewed the triage vital signs and the nursing notes.  Pertinent labs & imaging results that were available during my care of the patient were reviewed by me and considered in my medical decision making (see chart for details).      Final Clinical Impressions(s) / UC Diagnoses   Final diagnoses:  Acute low back pain, unspecified back pain laterality, unspecified whether sciatica present     Discharge Instructions      Return if any problems.     ED Prescriptions     Medication Sig Dispense Auth. Provider   meloxicam (MOBIC) 7.5 MG tablet Take 1 tablet (7.5 mg total) by mouth daily. 20 tablet Cheron Schaumann K, [place]   tizanidine (ZANAFLEX) 2 MG capsule Take 1 capsule (2 mg total)  by mouth 3 (three) times daily. 30 capsule Elson Areas, [place]      PDMP not reviewed this encounter.   Elson Areas,  PA-C 02/21/23 0272

## 2023-03-21 ENCOUNTER — Ambulatory Visit
Admission: RE | Admit: 2023-03-21 | Discharge: 2023-03-21 | Disposition: A | Discharge status: Home / Self Care | Payer: Medicare HMO | Source: Ambulatory Visit | Attending: Internal Medicine | Admitting: Internal Medicine

## 2023-03-21 DIAGNOSIS — Z0000 Encounter for general adult medical examination without abnormal findings: Secondary | ICD-10-CM

## 2023-03-21 DIAGNOSIS — Z1231 Encounter for screening mammogram for malignant neoplasm of breast: Secondary | ICD-10-CM | POA: Diagnosis not present

## 2023-03-23 ENCOUNTER — Other Ambulatory Visit: Payer: Self-pay | Admitting: Internal Medicine

## 2023-03-23 DIAGNOSIS — R928 Other abnormal and inconclusive findings on diagnostic imaging of breast: Secondary | ICD-10-CM

## 2023-03-28 ENCOUNTER — Other Ambulatory Visit: Payer: Medicare HMO

## 2023-04-20 ENCOUNTER — Ambulatory Visit
Admission: RE | Admit: 2023-04-20 | Discharge: 2023-04-20 | Disposition: A | Discharge status: Home / Self Care | Payer: Medicare HMO | Source: Ambulatory Visit | Attending: Internal Medicine | Admitting: Internal Medicine

## 2023-04-20 DIAGNOSIS — R928 Other abnormal and inconclusive findings on diagnostic imaging of breast: Secondary | ICD-10-CM

## 2023-04-20 DIAGNOSIS — N6012 Diffuse cystic mastopathy of left breast: Secondary | ICD-10-CM | POA: Diagnosis not present

## 2023-04-20 DIAGNOSIS — N6321 Unspecified lump in the left breast, upper outer quadrant: Secondary | ICD-10-CM | POA: Diagnosis not present

## 2023-05-13 DIAGNOSIS — H5203 Hypermetropia, bilateral: Secondary | ICD-10-CM | POA: Diagnosis not present

## 2023-05-13 DIAGNOSIS — H2513 Age-related nuclear cataract, bilateral: Secondary | ICD-10-CM | POA: Diagnosis not present

## 2023-05-13 DIAGNOSIS — H25041 Posterior subcapsular polar age-related cataract, right eye: Secondary | ICD-10-CM | POA: Diagnosis not present

## 2023-08-01 DIAGNOSIS — Z1389 Encounter for screening for other disorder: Secondary | ICD-10-CM | POA: Diagnosis not present

## 2023-08-01 DIAGNOSIS — N183 Chronic kidney disease, stage 3 unspecified: Secondary | ICD-10-CM | POA: Diagnosis not present

## 2023-08-01 DIAGNOSIS — Z0000 Encounter for general adult medical examination without abnormal findings: Secondary | ICD-10-CM | POA: Diagnosis not present

## 2023-11-14 DIAGNOSIS — J45909 Unspecified asthma, uncomplicated: Secondary | ICD-10-CM | POA: Diagnosis not present

## 2023-11-14 DIAGNOSIS — M1990 Unspecified osteoarthritis, unspecified site: Secondary | ICD-10-CM | POA: Diagnosis not present

## 2023-11-14 DIAGNOSIS — R000 Tachycardia, unspecified: Secondary | ICD-10-CM | POA: Diagnosis not present

## 2023-11-14 DIAGNOSIS — E785 Hyperlipidemia, unspecified: Secondary | ICD-10-CM | POA: Diagnosis not present

## 2023-11-14 DIAGNOSIS — J302 Other seasonal allergic rhinitis: Secondary | ICD-10-CM | POA: Diagnosis not present

## 2023-11-14 DIAGNOSIS — M8580 Other specified disorders of bone density and structure, unspecified site: Secondary | ICD-10-CM | POA: Diagnosis not present

## 2023-11-14 DIAGNOSIS — E669 Obesity, unspecified: Secondary | ICD-10-CM | POA: Diagnosis not present

## 2023-11-14 DIAGNOSIS — D72819 Decreased white blood cell count, unspecified: Secondary | ICD-10-CM | POA: Diagnosis not present

## 2023-11-14 DIAGNOSIS — I1310 Hypertensive heart and chronic kidney disease without heart failure, with stage 1 through stage 4 chronic kidney disease, or unspecified chronic kidney disease: Secondary | ICD-10-CM | POA: Diagnosis not present

## 2023-11-14 DIAGNOSIS — N1831 Chronic kidney disease, stage 3a: Secondary | ICD-10-CM | POA: Diagnosis not present

## 2024-01-31 DIAGNOSIS — E785 Hyperlipidemia, unspecified: Secondary | ICD-10-CM | POA: Diagnosis not present

## 2024-01-31 DIAGNOSIS — Z1212 Encounter for screening for malignant neoplasm of rectum: Secondary | ICD-10-CM | POA: Diagnosis not present

## 2024-01-31 DIAGNOSIS — M8580 Other specified disorders of bone density and structure, unspecified site: Secondary | ICD-10-CM | POA: Diagnosis not present

## 2024-01-31 DIAGNOSIS — I129 Hypertensive chronic kidney disease with stage 1 through stage 4 chronic kidney disease, or unspecified chronic kidney disease: Secondary | ICD-10-CM | POA: Diagnosis not present

## 2024-01-31 DIAGNOSIS — K219 Gastro-esophageal reflux disease without esophagitis: Secondary | ICD-10-CM | POA: Diagnosis not present

## 2024-01-31 DIAGNOSIS — N1831 Chronic kidney disease, stage 3a: Secondary | ICD-10-CM | POA: Diagnosis not present

## 2024-02-07 DIAGNOSIS — Z0000 Encounter for general adult medical examination without abnormal findings: Secondary | ICD-10-CM | POA: Diagnosis not present

## 2024-02-07 DIAGNOSIS — M25551 Pain in right hip: Secondary | ICD-10-CM | POA: Diagnosis not present

## 2024-02-07 DIAGNOSIS — J302 Other seasonal allergic rhinitis: Secondary | ICD-10-CM | POA: Diagnosis not present

## 2024-02-07 DIAGNOSIS — Z1339 Encounter for screening examination for other mental health and behavioral disorders: Secondary | ICD-10-CM | POA: Diagnosis not present

## 2024-02-07 DIAGNOSIS — R000 Tachycardia, unspecified: Secondary | ICD-10-CM | POA: Diagnosis not present

## 2024-02-07 DIAGNOSIS — K219 Gastro-esophageal reflux disease without esophagitis: Secondary | ICD-10-CM | POA: Diagnosis not present

## 2024-02-07 DIAGNOSIS — I129 Hypertensive chronic kidney disease with stage 1 through stage 4 chronic kidney disease, or unspecified chronic kidney disease: Secondary | ICD-10-CM | POA: Diagnosis not present

## 2024-02-07 DIAGNOSIS — M8580 Other specified disorders of bone density and structure, unspecified site: Secondary | ICD-10-CM | POA: Diagnosis not present

## 2024-02-07 DIAGNOSIS — Z1331 Encounter for screening for depression: Secondary | ICD-10-CM | POA: Diagnosis not present

## 2024-02-07 DIAGNOSIS — E785 Hyperlipidemia, unspecified: Secondary | ICD-10-CM | POA: Diagnosis not present

## 2024-02-07 DIAGNOSIS — N1831 Chronic kidney disease, stage 3a: Secondary | ICD-10-CM | POA: Diagnosis not present

## 2024-02-07 DIAGNOSIS — J45909 Unspecified asthma, uncomplicated: Secondary | ICD-10-CM | POA: Diagnosis not present

## 2024-02-07 DIAGNOSIS — R82998 Other abnormal findings in urine: Secondary | ICD-10-CM | POA: Diagnosis not present

## 2024-02-07 DIAGNOSIS — M1990 Unspecified osteoarthritis, unspecified site: Secondary | ICD-10-CM | POA: Diagnosis not present

## 2024-02-07 DIAGNOSIS — E669 Obesity, unspecified: Secondary | ICD-10-CM | POA: Diagnosis not present

## 2024-02-16 ENCOUNTER — Other Ambulatory Visit: Payer: Self-pay | Admitting: Internal Medicine

## 2024-02-16 DIAGNOSIS — Z1231 Encounter for screening mammogram for malignant neoplasm of breast: Secondary | ICD-10-CM

## 2024-03-28 ENCOUNTER — Ambulatory Visit

## 2024-04-24 ENCOUNTER — Ambulatory Visit

## 2024-04-27 ENCOUNTER — Ambulatory Visit
Admission: RE | Admit: 2024-04-27 | Discharge: 2024-04-27 | Disposition: A | Discharge status: Home / Self Care | Source: Ambulatory Visit | Attending: Internal Medicine | Admitting: Internal Medicine

## 2024-04-27 DIAGNOSIS — Z1231 Encounter for screening mammogram for malignant neoplasm of breast: Secondary | ICD-10-CM

## 2024-05-14 DIAGNOSIS — H25041 Posterior subcapsular polar age-related cataract, right eye: Secondary | ICD-10-CM | POA: Diagnosis not present

## 2024-05-14 DIAGNOSIS — H2513 Age-related nuclear cataract, bilateral: Secondary | ICD-10-CM | POA: Diagnosis not present

## 2024-05-14 DIAGNOSIS — H5203 Hypermetropia, bilateral: Secondary | ICD-10-CM | POA: Diagnosis not present

## 2024-06-07 ENCOUNTER — Ambulatory Visit (INDEPENDENT_AMBULATORY_CARE_PROVIDER_SITE_OTHER)

## 2024-06-07 ENCOUNTER — Ambulatory Visit (HOSPITAL_COMMUNITY)
Admission: EM | Admit: 2024-06-07 | Discharge: 2024-06-07 | Disposition: A | Discharge status: Home / Self Care | Attending: Family Medicine | Admitting: Family Medicine

## 2024-06-07 ENCOUNTER — Encounter (HOSPITAL_COMMUNITY): Payer: Self-pay | Admitting: Emergency Medicine

## 2024-06-07 DIAGNOSIS — M160 Bilateral primary osteoarthritis of hip: Secondary | ICD-10-CM | POA: Diagnosis not present

## 2024-06-07 DIAGNOSIS — M25552 Pain in left hip: Secondary | ICD-10-CM

## 2024-06-07 MED ORDER — PREDNISONE 20 MG PO TABS: 40.0000 mg | ORAL_TABLET | Freq: Every day | ORAL | 0 refills | Status: AC

## 2024-06-07 NOTE — ED Provider Notes (Signed)
 [facility]    CSN: [id number] Arrival date & time: 06/07/24  1508      History   Chief Complaint Chief Complaint  Patient presents with   Hip Pain    HPI Regina Poole is a 73 y.o. female.    Hip Pain  Here for pain in her left posterior hip for about a.  It began suddenly today.  There was no trauma or fall.  No fever.  It hurts more when she leans over or arises to a sitting position or when she twists at her torso.  She does not have diabetes  She is allergic to azithromycin  Her last EGFR in October 2024 was in the 40s, found in care everywhere notes   Past Medical History:  Diagnosis Date   Arthritis    High cholesterol    Hypertension    Osteoporosis    Pneumonia     There are no active problems to display for this patient.   History reviewed. No pertinent surgical history.  OB History   No obstetric history on file.      Home Medications    Prior to Admission medications   Medication Sig Start Date End Date Taking? Authorizing Provider  predniSONE  (DELTASONE ) 20 MG tablet Take 2 tablets (40 mg total) by mouth daily with breakfast for 5 days. 06/07/24 06/12/24 Yes Vonna Sharlet POUR, MD  albuterol  (PROAIR  HFA) 108 (90 BASE) MCG/ACT inhaler Inhale 2 puffs into the lungs 2 (two) times daily. Wheezing    [provider]  benazepril-hydrochlorthiazide (LOTENSIN HCT) 20-12.5 MG per tablet Take 1 tablet by mouth daily.    [provider]  diclofenac  Sodium (VOLTAREN ) 1 % GEL Apply 4 g topically 4 (four) times daily. 03/18/20   Darr, Jacob, PA-C  fluticasone-salmeterol (ADVAIR HFA) 45-21 MCG/ACT inhaler Inhale 2 puffs into the lungs 2 (two) times daily.    [provider]  Multiple Vitamins-Minerals (MULTIVITAMIN WITH MINERALS) tablet Take 1 tablet by mouth daily.    [provider]  simvastatin (ZOCOR) 20 MG tablet Take 20 mg by mouth every evening.    [provider]    Family History No  family history on file.  Social History Social History   Tobacco Use   Smoking status: Never   Smokeless tobacco: Never  Vaping Use   Vaping status: Never Used  Substance Use Topics   Alcohol  use: No   Drug use: No     Allergies   Azithromycin   Review of Systems Review of Systems   Physical Exam Triage Vital Signs ED Triage Vitals  Encounter Vitals Group     BP 06/07/24 1647 (!) 158/89     Girls Systolic BP Percentile --      Girls Diastolic BP Percentile --      Boys Systolic BP Percentile --      Boys Diastolic BP Percentile --      Pulse Rate 06/07/24 1647 73     Resp 06/07/24 1647 20     Temp 06/07/24 1647 98.5 F (36.9 C)     Temp Source 06/07/24 1647 Oral     SpO2 06/07/24 1647 97 %     Weight --      Height --      Head Circumference --      Peak Flow --      Pain Score 06/07/24 1650 10     Pain Loc --      Pain Education --  Exclude from Growth Chart --    No data found.  Updated Vital Signs BP (!) 158/89 (BP Location: Left Arm)   Pulse 73   Temp 98.5 F (36.9 C) (Oral)   Resp 20   SpO2 97%   Visual Acuity Right Eye Distance:   Left Eye Distance:   Bilateral Distance:    Right Eye Near:   Left Eye Near:    Bilateral Near:     Physical Exam Vitals reviewed.  Constitutional:      General: She is not in acute distress.    Appearance: She is not ill-appearing, toxic-appearing or diaphoretic.  Musculoskeletal:     Comments: There is some tenderness over the left buttock area.  No erythema or deformity.  No rash  Skin:    Coloration: Skin is not pale.  Neurological:     General: No focal deficit present.     Mental Status: She is alert and oriented to person, place, and time.  Psychiatric:        Behavior: Behavior normal.      UC Treatments / Results  Labs (all labs ordered are listed, but only abnormal results are displayed) Labs Reviewed - No data to display  EKG   Radiology DG Pelvis 1-2 Views Result Date:  06/07/2024 CLINICAL DATA:  Left hip pain.  No trauma. EXAM: PELVIS - 1-2 VIEW COMPARISON:  None Available. FINDINGS: No acute fracture or dislocation. Mild bilateral hip arthritic changes. Calcific densities noted over the pelvis. IMPRESSION: 1. No acute fracture or dislocation. 2. Mild bilateral hip arthritic changes. Electronically Signed   By: Vanetta Chou M.D.   On: 06/07/2024 17:56    Procedures Procedures (including critical care time)  Medications Ordered in UC Medications - No data to display  Initial Impression / Assessment and Plan / UC Course  I have reviewed the triage vital signs and the nursing notes.  Pertinent labs & imaging results that were available during my care of the patient were reviewed by me and considered in my medical decision making (see chart for details).     There is some mild arthritis in the hip joints, but no acute bony abnormality on x-rays.  Prednisone  burst is sent in and I have recommended extra strength Tylenol  as needed for pain. Final Clinical Impressions(s) / UC Diagnoses   Final diagnoses:  Pain of left hip     Discharge Instructions      Your x-rays do not show any broken bones or cysts in the bones, but there are some signs of arthritis in your hip joints.  Take prednisone  20 mg--2 daily for 5 days  Also take Tylenol /acetaminophen --500 mg over-the-counter--2 tablets every 6 hours as needed for pain.  Please follow-up with your primary care about this issue     ED Prescriptions     Medication Sig Dispense Auth. Provider   predniSONE  (DELTASONE ) 20 MG tablet Take 2 tablets (40 mg total) by mouth daily with breakfast for 5 days. 10 tablet Vonna Keora Eccleston K, MD      PDMP not reviewed this encounter.   Vonna Sharlet POUR, MD 06/07/24 [phone]

## 2024-06-07 NOTE — ED Triage Notes (Signed)
 Pt st's after going shopping today she returned home and had sudden onset of pain in left hip  Pt denies injury

## 2024-06-07 NOTE — Discharge Instructions (Signed)
 Your x-rays do not show any broken bones or cysts in the bones, but there are some signs of arthritis in your hip joints.  Take prednisone  20 mg--2 daily for 5 days  Also take Tylenol /acetaminophen --500 mg over-the-counter--2 tablets every 6 hours as needed for pain.  Please follow-up with your primary care about this issue

## 2024-08-06 DIAGNOSIS — E669 Obesity, unspecified: Secondary | ICD-10-CM | POA: Diagnosis not present

## 2024-08-06 DIAGNOSIS — E785 Hyperlipidemia, unspecified: Secondary | ICD-10-CM | POA: Diagnosis not present

## 2024-08-06 DIAGNOSIS — M1990 Unspecified osteoarthritis, unspecified site: Secondary | ICD-10-CM | POA: Diagnosis not present

## 2024-08-06 DIAGNOSIS — I129 Hypertensive chronic kidney disease with stage 1 through stage 4 chronic kidney disease, or unspecified chronic kidney disease: Secondary | ICD-10-CM | POA: Diagnosis not present

## 2024-08-06 DIAGNOSIS — N1831 Chronic kidney disease, stage 3a: Secondary | ICD-10-CM | POA: Diagnosis not present

## 2024-08-06 DIAGNOSIS — J45909 Unspecified asthma, uncomplicated: Secondary | ICD-10-CM | POA: Diagnosis not present

## 2024-08-06 DIAGNOSIS — M25551 Pain in right hip: Secondary | ICD-10-CM | POA: Diagnosis not present

## 2024-08-06 DIAGNOSIS — J302 Other seasonal allergic rhinitis: Secondary | ICD-10-CM | POA: Diagnosis not present

## 2024-08-06 DIAGNOSIS — R000 Tachycardia, unspecified: Secondary | ICD-10-CM | POA: Diagnosis not present

## 2024-08-06 DIAGNOSIS — K219 Gastro-esophageal reflux disease without esophagitis: Secondary | ICD-10-CM | POA: Diagnosis not present

## 2024-08-06 DIAGNOSIS — M8580 Other specified disorders of bone density and structure, unspecified site: Secondary | ICD-10-CM | POA: Diagnosis not present

## 2024-09-03 DIAGNOSIS — I1310 Hypertensive heart and chronic kidney disease without heart failure, with stage 1 through stage 4 chronic kidney disease, or unspecified chronic kidney disease: Secondary | ICD-10-CM | POA: Diagnosis not present

## 2024-09-03 DIAGNOSIS — N1831 Chronic kidney disease, stage 3a: Secondary | ICD-10-CM | POA: Diagnosis not present

## 2024-09-03 DIAGNOSIS — E785 Hyperlipidemia, unspecified: Secondary | ICD-10-CM | POA: Diagnosis not present
